# Patient Record
Sex: Female | Born: 1955 | Race: White | Hispanic: No | Smoking: Never smoker
Health system: Southern US, Community
[De-identification: ages and names within clinical notes are randomized; demographics above are authoritative.]

## PROBLEM LIST (undated history)

## (undated) DIAGNOSIS — Z8739 Personal history of other diseases of the musculoskeletal system and connective tissue: Secondary | ICD-10-CM

## (undated) DIAGNOSIS — N920 Excessive and frequent menstruation with regular cycle: Secondary | ICD-10-CM

## (undated) DIAGNOSIS — B379 Candidiasis, unspecified: Secondary | ICD-10-CM

## (undated) DIAGNOSIS — N898 Other specified noninflammatory disorders of vagina: Secondary | ICD-10-CM

## (undated) DIAGNOSIS — L900 Lichen sclerosus et atrophicus: Secondary | ICD-10-CM

## (undated) DIAGNOSIS — Z8669 Personal history of other diseases of the nervous system and sense organs: Secondary | ICD-10-CM

## (undated) DIAGNOSIS — B9689 Other specified bacterial agents as the cause of diseases classified elsewhere: Secondary | ICD-10-CM

## (undated) DIAGNOSIS — N939 Abnormal uterine and vaginal bleeding, unspecified: Secondary | ICD-10-CM

## (undated) DIAGNOSIS — N952 Postmenopausal atrophic vaginitis: Secondary | ICD-10-CM

## (undated) DIAGNOSIS — N3010 Interstitial cystitis (chronic) without hematuria: Secondary | ICD-10-CM

## (undated) DIAGNOSIS — D219 Benign neoplasm of connective and other soft tissue, unspecified: Secondary | ICD-10-CM

## (undated) DIAGNOSIS — N760 Acute vaginitis: Secondary | ICD-10-CM

## (undated) DIAGNOSIS — N946 Dysmenorrhea, unspecified: Secondary | ICD-10-CM

## (undated) HISTORY — DX: Personal history of other diseases of the musculoskeletal system and connective tissue: Z87.39

## (undated) HISTORY — DX: Acute vaginitis: N76.0

## (undated) HISTORY — PX: BREAST BIOPSY: SHX20

## (undated) HISTORY — DX: Dysmenorrhea, unspecified: N94.6

## (undated) HISTORY — DX: Abnormal uterine and vaginal bleeding, unspecified: N93.9

## (undated) HISTORY — DX: Other specified bacterial agents as the cause of diseases classified elsewhere: B96.89

## (undated) HISTORY — DX: Lichen sclerosus et atrophicus: L90.0

## (undated) HISTORY — PX: ABDOMINAL HYSTERECTOMY: SHX81

## (undated) HISTORY — DX: Benign neoplasm of connective and other soft tissue, unspecified: D21.9

## (undated) HISTORY — DX: Postmenopausal atrophic vaginitis: N95.2

## (undated) HISTORY — PX: TUBAL LIGATION: SHX77

## (undated) HISTORY — DX: Candidiasis, unspecified: B37.9

## (undated) HISTORY — DX: Other specified noninflammatory disorders of vagina: N89.8

## (undated) HISTORY — DX: Interstitial cystitis (chronic) without hematuria: N30.10

## (undated) HISTORY — DX: Excessive and frequent menstruation with regular cycle: N92.0

## (undated) HISTORY — DX: Personal history of other diseases of the nervous system and sense organs: Z86.69

---

## 1990-03-19 DIAGNOSIS — R87613 High grade squamous intraepithelial lesion on cytologic smear of cervix (HGSIL): Secondary | ICD-10-CM | POA: Insufficient documentation

## 1990-04-30 DIAGNOSIS — Q5210 Doubling of vagina, unspecified: Secondary | ICD-10-CM | POA: Insufficient documentation

## 1990-07-02 DIAGNOSIS — N946 Dysmenorrhea, unspecified: Secondary | ICD-10-CM | POA: Insufficient documentation

## 1990-08-13 DIAGNOSIS — N920 Excessive and frequent menstruation with regular cycle: Secondary | ICD-10-CM | POA: Insufficient documentation

## 1998-08-24 ENCOUNTER — Other Ambulatory Visit: Admission: RE | Admit: 1998-08-24 | Discharge: 1998-08-24 | Payer: Self-pay | Admitting: Obstetrics and Gynecology

## 1999-07-25 ENCOUNTER — Other Ambulatory Visit: Admission: RE | Admit: 1999-07-25 | Discharge: 1999-07-25 | Payer: Self-pay | Admitting: Obstetrics and Gynecology

## 2000-07-02 DIAGNOSIS — D219 Benign neoplasm of connective and other soft tissue, unspecified: Secondary | ICD-10-CM | POA: Insufficient documentation

## 2000-08-07 ENCOUNTER — Other Ambulatory Visit: Admission: RE | Admit: 2000-08-07 | Discharge: 2000-08-07 | Payer: Self-pay | Admitting: Obstetrics and Gynecology

## 2000-09-03 ENCOUNTER — Encounter (INDEPENDENT_AMBULATORY_CARE_PROVIDER_SITE_OTHER): Payer: Self-pay | Admitting: Specialist

## 2000-09-03 ENCOUNTER — Other Ambulatory Visit: Admission: RE | Admit: 2000-09-03 | Discharge: 2000-09-03 | Payer: Self-pay | Admitting: Obstetrics and Gynecology

## 2000-10-14 ENCOUNTER — Encounter (INDEPENDENT_AMBULATORY_CARE_PROVIDER_SITE_OTHER): Payer: Self-pay | Admitting: Specialist

## 2000-10-14 ENCOUNTER — Observation Stay (HOSPITAL_COMMUNITY): Admission: RE | Admit: 2000-10-14 | Discharge: 2000-10-15 | Payer: Self-pay | Admitting: Obstetrics and Gynecology

## 2001-12-24 ENCOUNTER — Other Ambulatory Visit: Admission: RE | Admit: 2001-12-24 | Discharge: 2001-12-24 | Payer: Self-pay | Admitting: Obstetrics and Gynecology

## 2001-12-24 DIAGNOSIS — Z634 Disappearance and death of family member: Secondary | ICD-10-CM | POA: Insufficient documentation

## 2002-07-02 DIAGNOSIS — D219 Benign neoplasm of connective and other soft tissue, unspecified: Secondary | ICD-10-CM | POA: Insufficient documentation

## 2004-02-11 ENCOUNTER — Other Ambulatory Visit: Admission: RE | Admit: 2004-02-11 | Discharge: 2004-02-11 | Payer: Self-pay | Admitting: Obstetrics and Gynecology

## 2005-03-28 ENCOUNTER — Other Ambulatory Visit: Admission: RE | Admit: 2005-03-28 | Discharge: 2005-03-28 | Payer: Self-pay | Admitting: Obstetrics and Gynecology

## 2006-05-30 DIAGNOSIS — B3731 Acute candidiasis of vulva and vagina: Secondary | ICD-10-CM | POA: Insufficient documentation

## 2006-06-17 DIAGNOSIS — N762 Acute vulvitis: Secondary | ICD-10-CM | POA: Insufficient documentation

## 2006-08-12 DIAGNOSIS — L900 Lichen sclerosus et atrophicus: Secondary | ICD-10-CM | POA: Insufficient documentation

## 2007-07-18 ENCOUNTER — Encounter: Admission: RE | Admit: 2007-07-18 | Discharge: 2007-07-18 | Payer: Self-pay | Admitting: Obstetrics and Gynecology

## 2007-07-18 ENCOUNTER — Encounter (INDEPENDENT_AMBULATORY_CARE_PROVIDER_SITE_OTHER): Payer: Self-pay | Admitting: Diagnostic Radiology

## 2008-01-16 ENCOUNTER — Encounter: Admission: RE | Admit: 2008-01-16 | Discharge: 2008-01-16 | Payer: Self-pay | Admitting: Obstetrics and Gynecology

## 2008-07-30 ENCOUNTER — Encounter: Admission: RE | Admit: 2008-07-30 | Discharge: 2008-07-30 | Payer: Self-pay | Admitting: Obstetrics and Gynecology

## 2008-08-09 DIAGNOSIS — M8580 Other specified disorders of bone density and structure, unspecified site: Secondary | ICD-10-CM | POA: Insufficient documentation

## 2009-08-26 ENCOUNTER — Encounter: Admission: RE | Admit: 2009-08-26 | Discharge: 2009-08-26 | Payer: Self-pay | Admitting: Obstetrics and Gynecology

## 2010-08-24 ENCOUNTER — Other Ambulatory Visit: Payer: Self-pay | Admitting: Obstetrics and Gynecology

## 2010-08-24 DIAGNOSIS — Z1231 Encounter for screening mammogram for malignant neoplasm of breast: Secondary | ICD-10-CM

## 2010-09-08 ENCOUNTER — Ambulatory Visit
Admission: RE | Admit: 2010-09-08 | Discharge: 2010-09-08 | Disposition: A | Discharge status: Home / Self Care | Payer: No Typology Code available for payment source | Source: Ambulatory Visit | Attending: Obstetrics and Gynecology | Admitting: Obstetrics and Gynecology

## 2010-09-08 DIAGNOSIS — Z1231 Encounter for screening mammogram for malignant neoplasm of breast: Secondary | ICD-10-CM

## 2010-11-17 NOTE — Op Note (Signed)
[facility]  Patient:    Angela Love, Angela Love                       MRN: [id number] Proc. Date: 10/14/00 Attending:  Erie Noe P. Pennie Rushing, M.D.                           Operative Report  PREOPERATIVE DIAGNOSES:       1. Uterine fibroids.                               2. Menorrhagia.                               3. Abnormal uterine bleeding.  POSTOPERATIVE DIAGNOSES:      1. Uterine fibroids.                               2. Menorrhagia.                               3. Abnormal uterine bleeding.  OPERATION:                    Total vaginal hysterectomy.  SURGEON:                      Vanessa P. Pennie Rushing, M.D.  ASSISTANT:                    Henreitta Leber, P.A.C.  ANESTHESIA:                   General orotracheal anesthesia.  ESTIMATED BLOOD LOSS:         150 cc.  COMPLICATIONS:                None.  FINDINGS:                     The uterus was enlarged to approximately 10-week size with a 4 to 5 cm myoma at the uterine fundus. The ovaries appeared normal bilaterally.  The tubes were status post tubal interruption.  DESCRIPTION OF PROCEDURE:     The patient was taken to the operating room after appropriate identification and placed on the operating table.  After the attainment of adequate general anesthesia, she was placed in the lithotomy position.  The perineum and vagina were prepped with multiple layers of Betadine and draped as a sterile field.  A Foley catheter was inserted into the bladder and connected to straight drainage.  A weighted speculum was placed in the posterior vagina and a dilute solution of Pitressin used to inject the cervicovaginal mucosa.  The cervix was then circumscribed and the bladder sharply dissected off the anterior cervix. The posterior peritoneum was then entered and tagged. The uterosacral ligaments on the right and left side were clamped, cut, and suture ligated.  The anterior peritoneum was entered.  The  paracervical tissues, parametrial tissues were clamped, sealed with a Ligasure and cut.  The uterus was then morcellated to allow inversion of the uterus with the removal of the cervix and several myomata with the intervening tissue. At that point, the remaining uterus could be inverted and the  upper pedicle on the left side sealed with a Ligasure and cut.  On the right side, the Ligasure was placed, the sealing done and the uterus cut away from the upper pedicle tissue; however, a Heaney clamp was then placed proximal to the Ligasure and the Ligasure cut away with that distal tissue and a suture of 0 Vicryl placed and tied fore and aft with a second suture of 0 Vicryl placed and tied fore and aft on the upper pedicle on the right side. This was held for further evaluation later.  Hemostasis was noted to be adequate except at the area of the right uterosacral ligament where the suture had pulled off and this suture was replaced with good hemostasis achieved. The upper pedicle on the right side had that suture cut.  The McCall culdoplasty suture was then placed in the posterior cul-de-sac incorporating the uterosacral ligaments and the intervening peritoneum and that suture was held.  The vaginal angles were then created with the held uterosacral ligaments through the anterior vaginal mucosa and the posterior vaginal mucosa with the angle tied down.  A similar procedure was carried out on the opposite side and the two vaginal angles were noted to be hemostatic.  The remainder of the vaginal cuff was then closed with figure-of-eight sutures of 0 Vicryl incorporating the anterior vaginal mucosa, anterior peritoneum, posterior peritoneum and posterior vaginal mucosa in each figure-of-eight suture.  These sutures were all of 0 Vicryl and hemostasis was noted to be adequate once the vaginal cuff had been closed. The culdoplasty suture was tied down. Hemostasis was noted to be adequate.  A short  period of time to insure adequate urine output after a fluid bolus was undertaken before the vaginal pack was placed.  The vaginal pack had been saturated with Estrace cream and was placed in the vagina.  At that time urine output had been 900 cc and clear.  The patient was awakened from general anesthesia and taken to the recovery room in satisfactory condition, having tolerated the procedure well with sponge and instrument counts correct.  SPECIMENS TO PATHOLOGY:       Uterus and cervix.   DD:  10/14/00 TD:  10/14/00 Job: [id number]

## 2010-11-17 NOTE — Discharge Summary (Signed)
[facility]  Patient:    Angela Love, Angela Love                        MRN: [id number] Adm. Date:  [id number] Disc. Date: [id number] Attending:  Shaune Spittle Dictator:   Marquis Lunch. Lowell Guitar, PA-C                           Discharge Summary  DISCHARGE DIAGNOSES:          Abnormal uterine bleeding and fibroid uterus.  PROCEDURE:                    Total vaginal hysterectomy.  HISTORY OF PRESENT ILLNESS:   Ms. Angela Love is a 55 year old separated white female para 2-0-0-2 with a several year history of abnormal uterine bleeding and uterine fibroids.  Patient presents for definitive treatment of her symptoms in the form of a total vaginal hysterectomy.  Please see patients dictated history and physical examination for details.  PHYSICAL EXAMINATION  VITAL SIGNS:                  Blood pressure 120/80, weight 138 pounds, height 5 feet 1 inch.  GENERAL:                      Within normal limits.  PELVIC:                       EG, BUS within normal limits.  Vagina is rugose. Cervix is nontender without lesions and is flush with the vagina.  Uterus is retroverted, approximately 8-10 weeks size, and firm.  Adnexa without tenderness or masses.  Rectovaginal without tenderness or masses.  HOSPITAL COURSE:              On the date of admission patient underwent a total vaginal hysterectomy tolerating procedure well.  The patient quickly resumed bowel and bladder function by postoperative day #1 and was deemed ready for discharge home.  Postoperative hemoglobin was 10, preoperative hemoglobin 12.1.  DISCHARGE MEDICATIONS:        1. Darvocet-N 100 one to two tablets q.4-h.                                  as needed for pain.                               2. Phenergan 12.5 mg one tablet q.i.d. as needed                                  for nausea.                               3. Stool softeners 60 mg one tablet b.i.d. until  bowel movements are regular.                               4. Patient was advised to resume her  preadmission medication.  FOLLOW-UP:                    Patient is to call [facility] for a six week postoperative examination with Dr. Pennie Rushing.  DISCHARGE INSTRUCTIONS:       She was given a copy of [facility] postoperative instruction sheet.  She was further advised to avoid driving for two weeks, heavy lifting for four weeks, and intercourse for six weeks.  FINAL PATHOLOGY:              Uterus and cervix:  Unremarkable cervix.  No dysplasia identified.  Secretory endometrium.  No evidence of hyperplasia or malignancy.  Leiomyomata, intramural. DD:  11/06/00 TD:  11/06/00 Job: [id number]

## 2010-11-17 NOTE — H&P (Signed)
[facility]  Patient:    Angela Love, Angela Love                        MRN: [id number] Adm. Date:  10/14/00 Attending:  Erie Noe P. Pennie Rushing, M.D. Dictator:   Henreitta Leber, P.A.-C.                         History and Physical  DATE OF BIRTH:                April 13, 1956  HISTORY OF PRESENT ILLNESS:   Angela Love is a 55 year old, separated white female, para 2-0-0-2, with a several-year history of abnormal uterine bleeding.  Patients menstrual flow of 7 to 10 days duration is characterized by heavy flow (has to change an extra absorbent pad every 30 minutes), severe cramping, and intermittent spotting throughout the month which has not been responsive to hormonal therapy.  Patients endometrial biopsy of March 2002 revealed disordered proliferative endometrium without hyperplasia or malignancy.  She was also found to have a normal TSH in February 2002. Patient presents for definitive treatment of her symptoms in the form of a total vaginal hysterectomy.  PAST MEDICAL HISTORY:         OB history:  Gravida 2, para 2-0-0-2.  Both of patients children were delivered by cesarean section.  GYN history:  Menarche at 55 years of age.  Patient has a history of irregular menstrual bleeding along with dysmenorrhea.  She has a past history of an abnormal PAP smear with her most recent one being normal, February 2002.  Patient uses laparoscopic tubal cautery as her method of contraception.  She has a history of uterine fibroids, fibrocystic breast changes, and a vaginal septum.  Patient had a normal mammogram, February 2002.  Surgical history:  Patient is status post cold knife conization of the cervix in 1991 with removal of her vaginal septum; she has had a laparoscopic tubal cautery; denies any history of blood transfusion.  Medical history is positive for interstitial cystitis and migraines.  FAMILY HISTORY:               Positive for hypertension and cancer.  CURRENT  MEDICATIONS:          Amitriptyline 25 mg at bedtime for  interstitial cystitis and migraines.  DRUG SENSITIVITIES:           Patient cannot tolerate codeine in that it causes severe nausea and vomiting.  She also develops a significant rash with swelling with the use of adhesives.  SOCIAL HISTORY:               Patient is separated and she works as a Consulting civil engineer.  HABITS:                       She does not use tobacco nor alcohol.  REVIEW OF SYSTEMS:            Positive for occasional arthralgias which are related to exercise, otherwise negative except as presented in history of present illness.  PHYSICAL EXAMINATION:  GENERAL:                      This is a well-developed, well-nourished, petite white female in no acute distress.  VITAL SIGNS:  Blood pressure 130/80, weight 138, height 5 feet, 1 inch tall.  EAR, NOSE, THROAT:            Exam is within normal limits.  Thyroid is not enlarged.  HEART:                        Regular rate and rhythm.  There is no murmur.  LUNGS:                        Without wheezes, rales, or rhonchi.  BACK:                         Without CVA tenderness.  ABDOMEN:                      Bowel sounds are present.  It is soft, nontender.  EXTREMITIES:                  Without clubbing, cyanosis, or edema.  PELVIC:                       EG, BUS is within normal limits.  Vagina is rugose.  Cervix is nontender without lesions and is flush with the vagina. Uterus is retroverted, approximately 8 to 10 weeks size and firm.  Adnexa without tenderness or masses.  Rectovaginal without tenderness or masses.  IMPRESSION:                   1. Abnormal uterine bleeding.                               2. Fibroid uterus.  DISPOSITION:                  A discussion was held with patient regarding medical and surgical management options for her symptoms; however, patient has chosen to undergo  hysterectomy.  She understands the implications for her procedure along with the risks involved to include but not limited to reaction to anesthesia, infection, bleeding, and damage to adjacent organs.  Patient has consented to undergo a total vaginal hysterectomy at [facility] [place] on October 14, 2000, at 7:30 a.m. DD:  10/08/00 TD:  10/08/00 Job: [id number]

## 2011-08-18 DIAGNOSIS — N762 Acute vulvitis: Secondary | ICD-10-CM

## 2011-08-18 DIAGNOSIS — M8580 Other specified disorders of bone density and structure, unspecified site: Secondary | ICD-10-CM

## 2011-08-18 DIAGNOSIS — Q5210 Doubling of vagina, unspecified: Secondary | ICD-10-CM

## 2011-08-18 DIAGNOSIS — B373 Candidiasis of vulva and vagina: Secondary | ICD-10-CM

## 2011-08-18 DIAGNOSIS — N946 Dysmenorrhea, unspecified: Secondary | ICD-10-CM

## 2011-08-18 DIAGNOSIS — R87613 High grade squamous intraepithelial lesion on cytologic smear of cervix (HGSIL): Secondary | ICD-10-CM

## 2011-08-18 DIAGNOSIS — L900 Lichen sclerosus et atrophicus: Secondary | ICD-10-CM

## 2011-08-18 DIAGNOSIS — B3731 Acute candidiasis of vulva and vagina: Secondary | ICD-10-CM

## 2011-08-18 DIAGNOSIS — Z634 Disappearance and death of family member: Secondary | ICD-10-CM

## 2011-08-18 DIAGNOSIS — D219 Benign neoplasm of connective and other soft tissue, unspecified: Secondary | ICD-10-CM

## 2011-08-18 DIAGNOSIS — N3010 Interstitial cystitis (chronic) without hematuria: Secondary | ICD-10-CM | POA: Insufficient documentation

## 2011-08-18 DIAGNOSIS — N920 Excessive and frequent menstruation with regular cycle: Secondary | ICD-10-CM

## 2011-09-25 ENCOUNTER — Other Ambulatory Visit: Payer: Self-pay | Admitting: Obstetrics and Gynecology

## 2011-09-25 DIAGNOSIS — Z1231 Encounter for screening mammogram for malignant neoplasm of breast: Secondary | ICD-10-CM

## 2011-10-04 ENCOUNTER — Ambulatory Visit (INDEPENDENT_AMBULATORY_CARE_PROVIDER_SITE_OTHER): Payer: No Typology Code available for payment source | Admitting: Obstetrics and Gynecology

## 2011-10-04 DIAGNOSIS — Z01419 Encounter for gynecological examination (general) (routine) without abnormal findings: Secondary | ICD-10-CM

## 2011-10-05 ENCOUNTER — Ambulatory Visit
Admission: RE | Admit: 2011-10-05 | Discharge: 2011-10-05 | Disposition: A | Discharge status: Home / Self Care | Payer: PRIVATE HEALTH INSURANCE | Source: Ambulatory Visit | Attending: Obstetrics and Gynecology | Admitting: Obstetrics and Gynecology

## 2011-10-05 DIAGNOSIS — Z1231 Encounter for screening mammogram for malignant neoplasm of breast: Secondary | ICD-10-CM

## 2012-08-28 ENCOUNTER — Other Ambulatory Visit: Payer: Self-pay | Admitting: Obstetrics and Gynecology

## 2012-08-28 ENCOUNTER — Other Ambulatory Visit: Payer: Self-pay

## 2012-08-28 DIAGNOSIS — Z1231 Encounter for screening mammogram for malignant neoplasm of breast: Secondary | ICD-10-CM

## 2012-10-10 ENCOUNTER — Ambulatory Visit
Admission: RE | Admit: 2012-10-10 | Discharge: 2012-10-10 | Disposition: A | Discharge status: Home / Self Care | Payer: BC Managed Care – PPO | Source: Ambulatory Visit

## 2012-10-10 DIAGNOSIS — Z1231 Encounter for screening mammogram for malignant neoplasm of breast: Secondary | ICD-10-CM

## 2013-09-29 ENCOUNTER — Other Ambulatory Visit: Payer: Self-pay

## 2013-09-29 DIAGNOSIS — Z1231 Encounter for screening mammogram for malignant neoplasm of breast: Secondary | ICD-10-CM

## 2013-10-16 ENCOUNTER — Ambulatory Visit
Admission: RE | Admit: 2013-10-16 | Discharge: 2013-10-16 | Disposition: A | Discharge status: Home / Self Care | Payer: BC Managed Care – PPO | Source: Ambulatory Visit

## 2013-10-16 DIAGNOSIS — Z1231 Encounter for screening mammogram for malignant neoplasm of breast: Secondary | ICD-10-CM

## 2014-11-16 ENCOUNTER — Other Ambulatory Visit: Payer: Self-pay

## 2014-11-16 DIAGNOSIS — Z1231 Encounter for screening mammogram for malignant neoplasm of breast: Secondary | ICD-10-CM

## 2014-11-22 ENCOUNTER — Ambulatory Visit
Admission: RE | Admit: 2014-11-22 | Discharge: 2014-11-22 | Disposition: A | Discharge status: Home / Self Care | Payer: Managed Care, Other (non HMO) | Source: Ambulatory Visit

## 2014-11-22 DIAGNOSIS — Z1231 Encounter for screening mammogram for malignant neoplasm of breast: Secondary | ICD-10-CM

## 2015-10-11 ENCOUNTER — Other Ambulatory Visit: Payer: Self-pay

## 2015-10-11 DIAGNOSIS — Z1231 Encounter for screening mammogram for malignant neoplasm of breast: Secondary | ICD-10-CM

## 2015-12-02 ENCOUNTER — Ambulatory Visit
Admission: RE | Admit: 2015-12-02 | Discharge: 2015-12-02 | Disposition: A | Discharge status: Home / Self Care | Payer: Managed Care, Other (non HMO) | Source: Ambulatory Visit

## 2015-12-02 DIAGNOSIS — Z1231 Encounter for screening mammogram for malignant neoplasm of breast: Secondary | ICD-10-CM

## 2016-11-16 ENCOUNTER — Other Ambulatory Visit: Payer: Self-pay | Admitting: Family Medicine

## 2016-11-16 DIAGNOSIS — Z1231 Encounter for screening mammogram for malignant neoplasm of breast: Secondary | ICD-10-CM

## 2016-12-07 ENCOUNTER — Ambulatory Visit
Admission: RE | Admit: 2016-12-07 | Discharge: 2016-12-07 | Disposition: A | Discharge status: Home / Self Care | Payer: Managed Care, Other (non HMO) | Source: Ambulatory Visit | Attending: Family Medicine | Admitting: Family Medicine

## 2016-12-07 DIAGNOSIS — Z1231 Encounter for screening mammogram for malignant neoplasm of breast: Secondary | ICD-10-CM

## 2017-12-06 ENCOUNTER — Other Ambulatory Visit: Payer: Self-pay | Admitting: Obstetrics and Gynecology

## 2017-12-06 DIAGNOSIS — Z1231 Encounter for screening mammogram for malignant neoplasm of breast: Secondary | ICD-10-CM

## 2017-12-27 ENCOUNTER — Ambulatory Visit
Admission: RE | Admit: 2017-12-27 | Discharge: 2017-12-27 | Disposition: A | Discharge status: Home / Self Care | Payer: Managed Care, Other (non HMO) | Source: Ambulatory Visit | Attending: Obstetrics and Gynecology | Admitting: Obstetrics and Gynecology

## 2017-12-27 DIAGNOSIS — Z1231 Encounter for screening mammogram for malignant neoplasm of breast: Secondary | ICD-10-CM

## 2019-01-27 ENCOUNTER — Other Ambulatory Visit: Payer: Self-pay | Admitting: Obstetrics and Gynecology

## 2019-01-27 DIAGNOSIS — Z1231 Encounter for screening mammogram for malignant neoplasm of breast: Secondary | ICD-10-CM

## 2019-03-13 ENCOUNTER — Other Ambulatory Visit: Payer: Self-pay

## 2019-03-13 ENCOUNTER — Ambulatory Visit
Admission: RE | Admit: 2019-03-13 | Discharge: 2019-03-13 | Disposition: A | Discharge status: Home / Self Care | Payer: BC Managed Care – PPO | Source: Ambulatory Visit | Attending: Obstetrics and Gynecology | Admitting: Obstetrics and Gynecology

## 2019-03-13 DIAGNOSIS — Z1231 Encounter for screening mammogram for malignant neoplasm of breast: Secondary | ICD-10-CM

## 2020-02-09 ENCOUNTER — Other Ambulatory Visit: Payer: Self-pay | Admitting: Obstetrics and Gynecology

## 2020-02-09 DIAGNOSIS — Z1231 Encounter for screening mammogram for malignant neoplasm of breast: Secondary | ICD-10-CM

## 2020-03-18 ENCOUNTER — Ambulatory Visit
Admission: RE | Admit: 2020-03-18 | Discharge: 2020-03-18 | Disposition: A | Discharge status: Home / Self Care | Payer: BC Managed Care – PPO | Source: Ambulatory Visit | Attending: Obstetrics and Gynecology | Admitting: Obstetrics and Gynecology

## 2020-03-18 ENCOUNTER — Other Ambulatory Visit: Payer: Self-pay

## 2020-03-18 DIAGNOSIS — Z1231 Encounter for screening mammogram for malignant neoplasm of breast: Secondary | ICD-10-CM

## 2021-02-08 ENCOUNTER — Other Ambulatory Visit: Payer: Self-pay | Admitting: Obstetrics and Gynecology

## 2021-02-08 DIAGNOSIS — Z1231 Encounter for screening mammogram for malignant neoplasm of breast: Secondary | ICD-10-CM

## 2021-03-31 ENCOUNTER — Other Ambulatory Visit: Payer: Self-pay

## 2021-03-31 ENCOUNTER — Ambulatory Visit
Admission: RE | Admit: 2021-03-31 | Discharge: 2021-03-31 | Disposition: A | Discharge status: Home / Self Care | Payer: Medicare Other | Source: Ambulatory Visit | Attending: Obstetrics and Gynecology | Admitting: Obstetrics and Gynecology

## 2021-03-31 DIAGNOSIS — Z1231 Encounter for screening mammogram for malignant neoplasm of breast: Secondary | ICD-10-CM

## 2021-05-19 ENCOUNTER — Other Ambulatory Visit: Payer: Self-pay | Admitting: Obstetrics and Gynecology

## 2022-03-23 ENCOUNTER — Other Ambulatory Visit: Payer: Self-pay | Admitting: Obstetrics and Gynecology

## 2022-03-23 DIAGNOSIS — Z1231 Encounter for screening mammogram for malignant neoplasm of breast: Secondary | ICD-10-CM

## 2022-04-18 ENCOUNTER — Ambulatory Visit
Admission: EM | Admit: 2022-04-18 | Discharge: 2022-04-18 | Disposition: A | Discharge status: Home / Self Care | Payer: Medicare Other | Attending: Family Medicine | Admitting: Family Medicine

## 2022-04-18 DIAGNOSIS — I10 Essential (primary) hypertension: Secondary | ICD-10-CM | POA: Diagnosis not present

## 2022-04-18 MED ORDER — LISINOPRIL 10 MG PO TABS: 10.0000 mg | ORAL_TABLET | Freq: Every day | ORAL | 1 refills | Status: DC

## 2022-04-18 NOTE — Discharge Instructions (Signed)
Start lisinopril tonight.  Take 1/2 tablet (5 mg) for the first 3 nights.  Keep track your blood pressure.  I anticipate you will need 10 mg a day for good control.  Call for problems. Recommend you establish with a primary care doctor.  Start calling today to get an appointment

## 2022-04-18 NOTE — ED Provider Notes (Signed)
[facility]    CSN: [id number] Arrival date & time: 04/18/22  1103      History   Chief Complaint Chief Complaint  Patient presents with   Hypertension    HPI Angela Love is a 66 y.o. female.   HPI  Patient is here for hypertension.  She has had high blood pressure off and on for years.  Previously was on amlodipine.  She stopped this because of pedal edema.  She states that she checks her blood pressure at home.  It stays in the 140/90 range.  She does not have a primary care doctor.  I reviewed her last medical visit with GYN earlier this year, her blood pressure was 160/102 at that time.  Today her blood pressure is higher, today blood pressure manually and it was 178/96 She has elevated blood pressure dating back to a visit in 2015 where it was 198/120 She went to her dentist today for routine cleaning and they would not take care of her without getting her blood pressure under control She agrees to establish with a PCP She is not having any headache, dizzy spells, visual changes, chest pain or shortness of breath  Past Medical History:  Diagnosis Date   Abnormal uterine bleeding    BV (bacterial vaginosis)    Dysmenorrhea    Fibroid    Resolved in 2002   H/O osteopenia    Hx of migraines    Interstitial cystitis    Lichen sclerosus Controlled    Menorrhagia    Monilia infection    Vaginal atrophy    Vaginal dryness     Patient Active Problem List   Diagnosis Date Noted   Interstitial cystitis 08/18/2011   Osteopenia 67/67/2094   Lichen sclerosus 70/96/2836   Vulvitis 06/17/2006   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      Home Medications    Prior to Admission medications   Medication Sig Start Date End Date Taking? Authorizing Provider  lisinopril (ZESTRIL) 10 MG tablet Take 1 tablet (10 mg total) by mouth daily. 04/18/22  Yes Raylene Everts, MD  amitriptyline (ELAVIL) 10 MG tablet Take 10 mg by mouth at bedtime. Unsure dose    [provider]  calcium carbonate 200 MG capsule Take 250 mg by mouth 2 (two) times daily with a meal. Unsure dose    [provider]  cholecalciferol (VITAMIN D) 1000 UNITS tablet Take 1,000 Units by mouth daily. Unsure dose    [provider]  clobetasol ointment (TEMOVATE) 0.05 % Apply topically 2 (two) times daily.    [provider]  estradiol (ESTRACE) 0.5 MG tablet Take 0.5 mg by mouth daily.    [provider]    Family History History reviewed. No pertinent family history.  Social History Social History   Tobacco Use   Smoking status: Never   Smokeless tobacco: Never  Vaping Use   Vaping Use: Never used     Allergies   Bextra [valdecoxib], Codeine, Darvocet [propoxyphene n-acetaminophen], Influenza vaccines, Metronidazole, Morphine  and related, and Other   Review of Systems Review of Systems See HPI  Physical Exam Triage Vital Signs ED Triage Vitals  Enc Vitals Group     BP 04/18/22 1133 (!) 200/130     Pulse Rate 04/18/22 1133 90     Resp 04/18/22 1133 18     Temp 04/18/22 1133 98.3 F (36.8 C)     Temp Source 04/18/22 1133 Oral     SpO2 04/18/22 1133 99 %     Weight --      Height --      Head Circumference --      Peak Flow --      Pain Score 04/18/22 1131 0     Pain Loc --      Pain Edu? --      Excl. in [place]? --    No data found.  Updated Vital Signs BP (!) 200/130 (BP Location: Left Arm)   Pulse 90   Temp 98.3 F (36.8 C) (Oral)   Resp 18   SpO2 99%      Physical Exam Constitutional:      General: She is not in acute  distress.    Appearance: She is well-developed.     Comments: Overweight  HENT:     Head: Normocephalic and atraumatic.  Eyes:     Conjunctiva/sclera: Conjunctivae normal.     Pupils: Pupils are equal, round, and reactive to light.  Cardiovascular:     Rate and Rhythm: Normal rate and regular rhythm.     Heart sounds: Normal heart sounds.  Pulmonary:     Effort: Pulmonary effort is normal. No respiratory distress.     Breath sounds: Normal breath sounds.  Abdominal:     General: There is no distension.     Palpations: Abdomen is soft.  Musculoskeletal:        General: Normal range of motion.     Cervical back: Normal range of motion.  Skin:    General: Skin is warm and dry.  Neurological:     Mental Status: She is alert.      UC Treatments / Results  Labs (all labs ordered are listed, but only abnormal results are displayed) Labs Reviewed - No data to display  EKG   Radiology No results found.  Procedures Procedures (including critical care time)  Medications Ordered in UC Medications - No data to display  Initial Impression / Assessment and Plan / UC Course  I have reviewed the triage vital signs and the nursing notes.  Pertinent labs & imaging results that were available during my care of the patient were reviewed by me and considered in my medical decision making (see chart for details).    I explained to the patient that she does have hypertension.  She needs to be on medication.  She needs lab work and regular medical visits.  PCP follow-up is emphasized Final Clinical Impressions(s) / UC Diagnoses   Final diagnoses:  Hypertension, unspecified type     Discharge Instructions      Start lisinopril tonight.  Take 1/2 tablet (5 mg) for the first 3 nights.  Keep track your blood pressure.  I anticipate you will need 10 mg a day for good control.  Call for problems. Recommend you establish with a primary care doctor.  Start calling today to get an  appointment   ED Prescriptions     Medication Sig Dispense Auth. Provider   lisinopril (ZESTRIL) 10 MG tablet Take  1 tablet (10 mg total) by mouth daily. 30 tablet Raylene Everts, MD      PDMP not reviewed this encounter.   Raylene Everts, MD 04/18/22 1229

## 2022-04-18 NOTE — ED Triage Notes (Addendum)
Pt here today for hypertension. Was at dentist when they took her BP and it was 170/108 . (Has dentist phobia) Went home and took again and it was 170/105. Has been borderline hypertensive for several years. Was rx'd amlodipine about 5 years but had to stop due to pitting edema. Home monitors and its been around 140/90 previously.

## 2022-04-19 ENCOUNTER — Telehealth: Payer: Self-pay | Admitting: Emergency Medicine

## 2022-04-19 NOTE — Telephone Encounter (Signed)
Call to see how Angela Love was today. No answer - voice mail left  for callback with any questions or concerns

## 2022-04-20 ENCOUNTER — Ambulatory Visit: Payer: Medicare Other

## 2022-05-08 IMAGING — MG MM DIGITAL SCREENING BILAT W/ TOMO AND CAD
8 series · 8 of 24 positions shown · non-contrast
Comparison: Previous exam(s).

ACR Breast Density Category a: The breast tissue is almost entirely
fatty.

CLINICAL DATA: Screening.

EXAM:
DIGITAL SCREENING BILATERAL MAMMOGRAM WITH TOMOSYNTHESIS AND CAD
TECHNIQUE: Bilateral screening digital craniocaudal and mediolateral oblique
mammograms were obtained. Bilateral screening digital breast
tomosynthesis was performed. The images were evaluated with
computer-aided detection.

[R CC synth-2D]
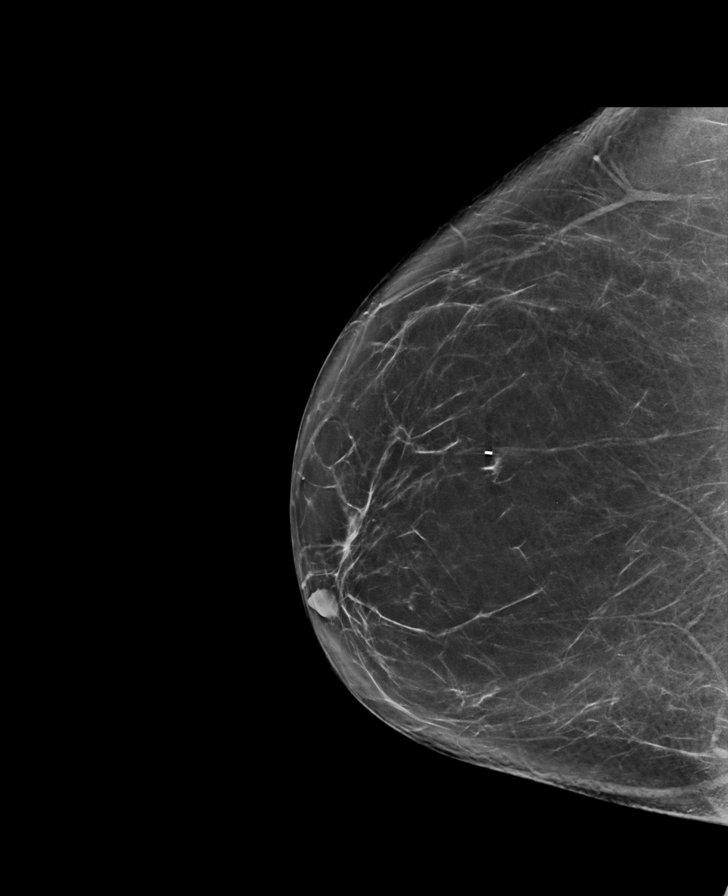

[L CC synth-2D]
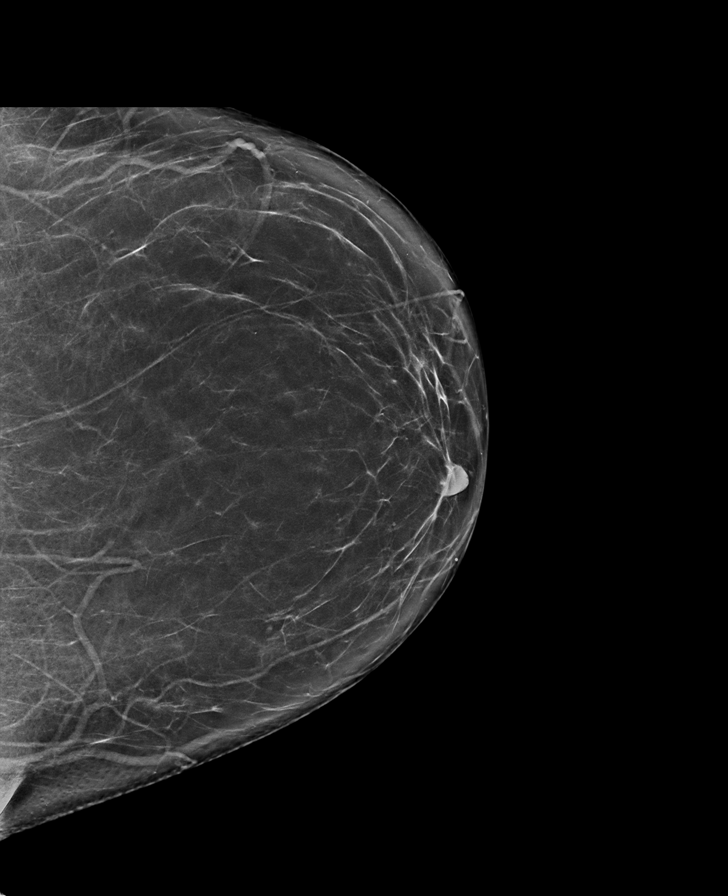

[L MLO synth-2D]
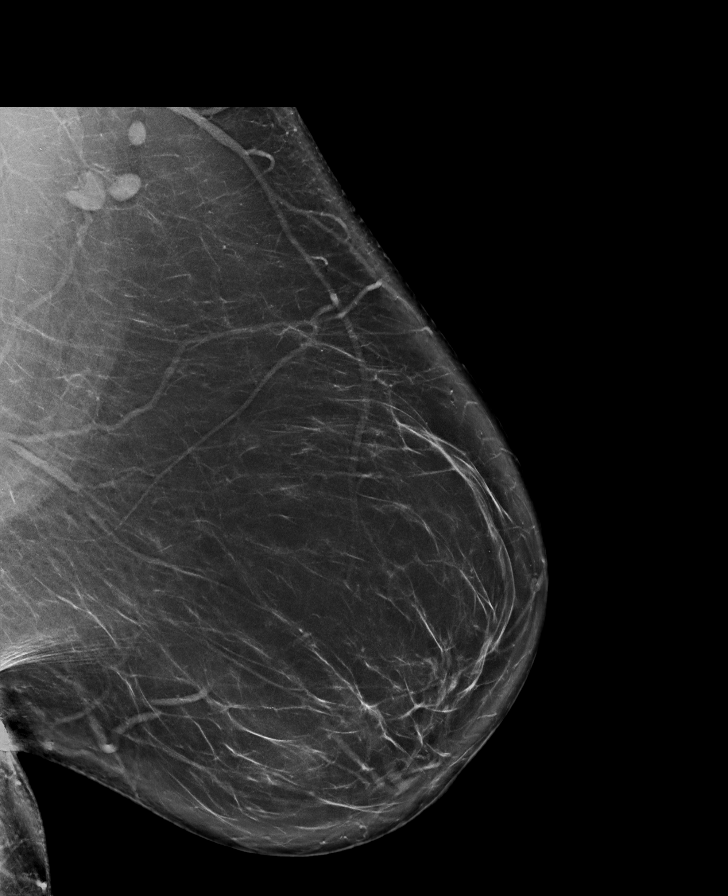

[R MLO synth-2D]
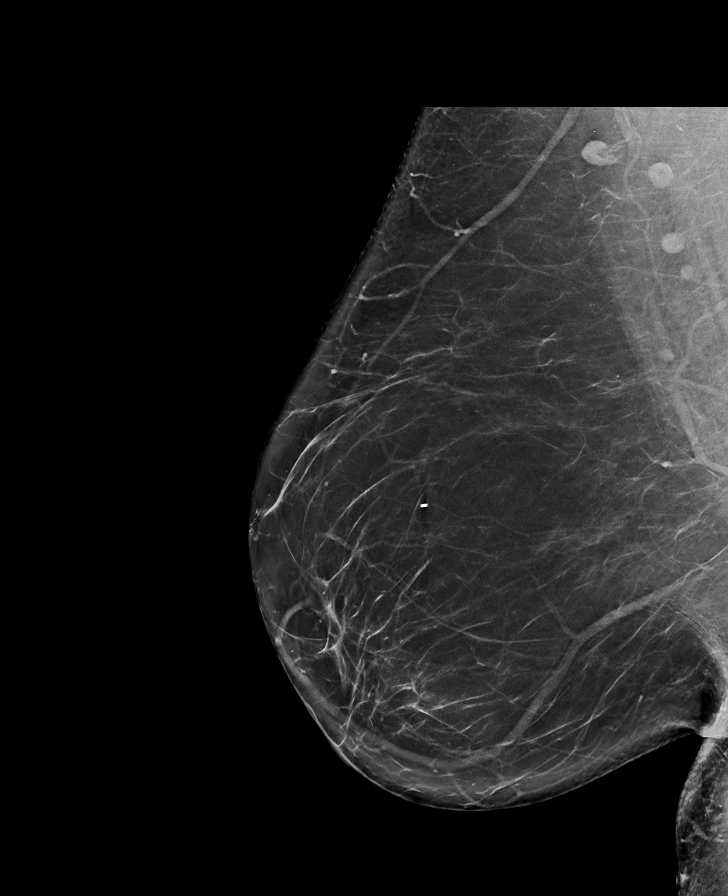

[L CC tomo · tomo slice 41/81.0]
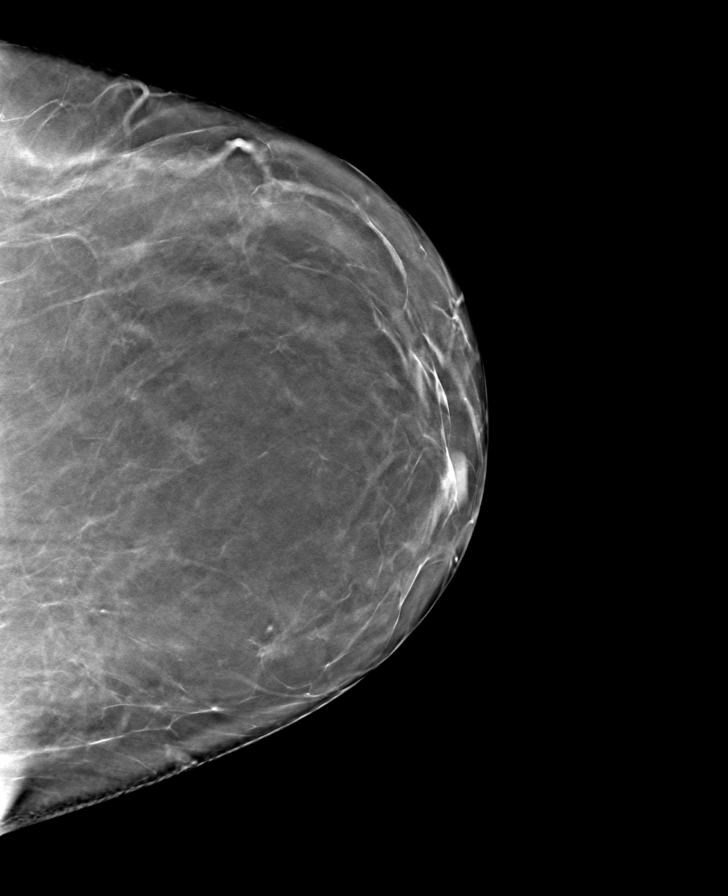

[L MLO tomo · tomo slice 47/94.0]
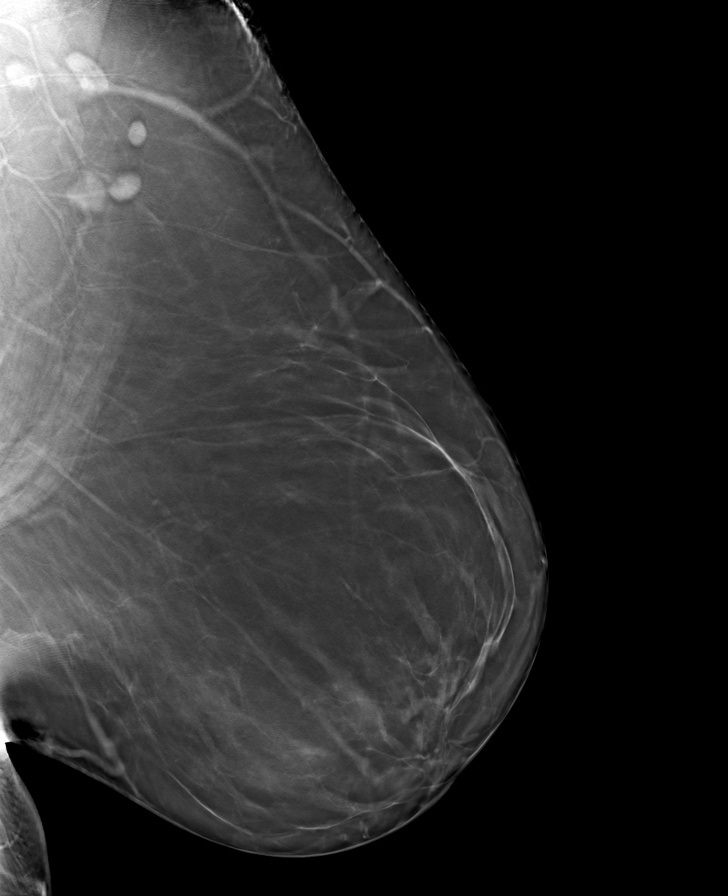

[R CC tomo · tomo slice 41/81.0]
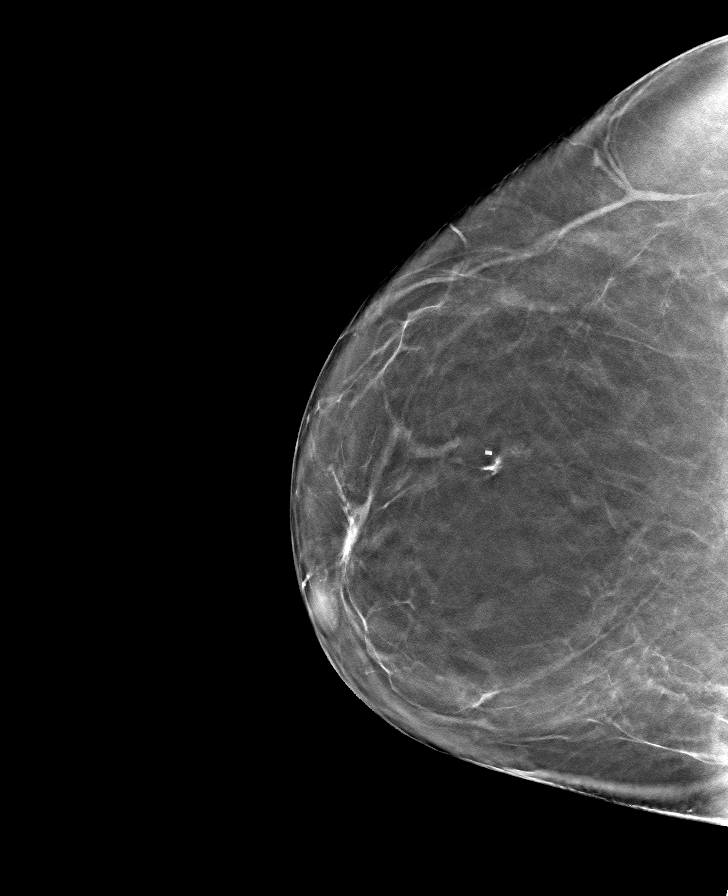

[R MLO tomo · tomo slice 45/90.0]
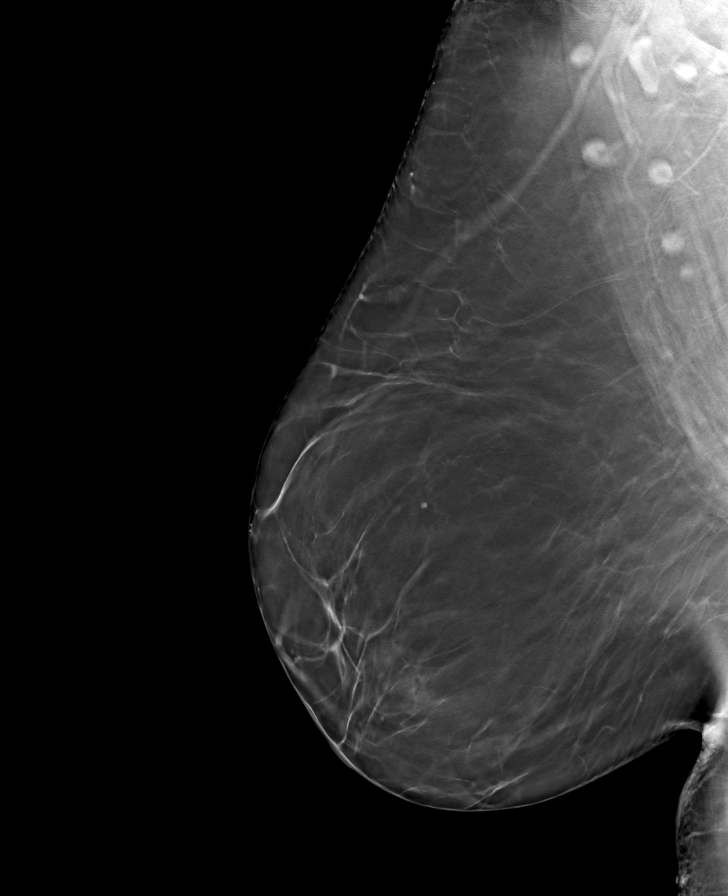

[8 of 24 positions shown; findings below may reference images not displayed]

FINDINGS: There are no findings suspicious for malignancy.
IMPRESSION: No mammographic evidence of malignancy. A result letter of this
screening mammogram will be mailed directly to the patient.

RECOMMENDATION:
Screening mammogram in one year. (Code:0E-3-N98)

BI-RADS CATEGORY  1: Negative.

## 2022-06-01 ENCOUNTER — Ambulatory Visit (INDEPENDENT_AMBULATORY_CARE_PROVIDER_SITE_OTHER): Payer: Medicare Other | Admitting: Family Medicine

## 2022-06-01 ENCOUNTER — Encounter: Payer: Self-pay | Admitting: Family Medicine

## 2022-06-01 VITALS — BP 184/91 | HR 103 | Ht 59.0 in | Wt 170.0 lb

## 2022-06-01 DIAGNOSIS — R7303 Prediabetes: Secondary | ICD-10-CM | POA: Diagnosis not present

## 2022-06-01 DIAGNOSIS — I10 Essential (primary) hypertension: Secondary | ICD-10-CM | POA: Diagnosis not present

## 2022-06-01 MED ORDER — LISINOPRIL 10 MG PO TABS: 10.0000 mg | ORAL_TABLET | Freq: Every day | ORAL | 3 refills | Status: DC

## 2022-06-01 NOTE — Assessment & Plan Note (Signed)
-   will continue patient at lisinopril '10mg'$  she says her Bps at work (she works at an [employer]) have been in the 120s/130s. Will have patient monitor and reach out if BP is elevated - have ordered CBC, CMP, microalbumin/cr ratio - have also ordered lipid panel

## 2022-06-01 NOTE — Progress Notes (Signed)
New Patient Office Visit  Subjective    Patient ID: Angela Love, female    DOB: 25-Apr-1956  Age: 66 y.o. MRN: [id number]  CC:  Chief Complaint  Patient presents with   Establish Care    HPI Angela Love presents to establish care. She has a hx of HTN and is on lisinopril '10mg'$ . She has been tolerating this very well. Her BP at work is 135/86.   Outpatient Encounter Medications as of 06/01/2022  Medication Sig   amitriptyline (ELAVIL) 10 MG tablet Take 10 mg by mouth at bedtime. Unsure dose   [DISCONTINUED] lisinopril (ZESTRIL) 10 MG tablet Take 1 tablet (10 mg total) by mouth daily.   clobetasol ointment (TEMOVATE) 0.05 % Apply topically 2 (two) times daily. (Patient not taking: Reported on 06/01/2022)   lisinopril (ZESTRIL) 10 MG tablet Take 1 tablet (10 mg total) by mouth daily.   [DISCONTINUED] calcium carbonate 200 MG capsule Take 250 mg by mouth 2 (two) times daily with a meal. Unsure dose (Patient not taking: Reported on 06/01/2022)   [DISCONTINUED] cholecalciferol (VITAMIN D) 1000 UNITS tablet Take 1,000 Units by mouth daily. Unsure dose (Patient not taking: Reported on 06/01/2022)   [DISCONTINUED] estradiol (ESTRACE) 0.5 MG tablet Take 0.5 mg by mouth daily. (Patient not taking: Reported on 06/01/2022)   No facility-administered encounter medications on file as of 06/01/2022.    Past Medical History:  Diagnosis Date   Abnormal uterine bleeding    BV (bacterial vaginosis)    Dysmenorrhea    Fibroid    Resolved in 2002   H/O osteopenia    Hx of migraines    Interstitial cystitis    Lichen sclerosus Controlled    Menorrhagia    Monilia infection    Vaginal atrophy    Vaginal dryness     Past Surgical History:  Procedure Laterality Date   BREAST BIOPSY Right    benign   TUBAL LIGATION      No family history on file.  Social History   Socioeconomic History   Marital status: Married    Spouse name: Not on file   Number of children: Not on file   Years of  education: Not on file   Highest education level: Not on file  Occupational History   Not on file  Tobacco Use   Smoking status: Never   Smokeless tobacco: Never  Vaping Use   Vaping Use: Never used  Substance and Sexual Activity   Alcohol use: Not on file   Drug use: Not on file   Sexual activity: Not on file  Other Topics Concern   Not on file  Social History Narrative   Not on file   Social Determinants of Health   Financial Resource Strain: Not on file  Food Insecurity: Not on file  Transportation Needs: Not on file  Physical Activity: Not on file  Stress: Not on file  Social Connections: Not on file  Intimate Partner Violence: Not on file    Review of Systems  Constitutional:  Negative for chills and fever.  Respiratory:  Negative for cough and shortness of breath.   Cardiovascular:  Negative for chest pain.  Neurological:  Negative for headaches.        Objective    BP (!) 184/91   Pulse (!) 103   Ht '4\' 11"'$  (1.499 m)   Wt 170 lb (77.1 kg)   SpO2 99%   BMI 34.34 kg/m   Physical Exam Vitals and nursing note reviewed.  Constitutional:      General: She is not in acute distress.    Appearance: Normal appearance.  HENT:     Head: Normocephalic and atraumatic.     Right Ear: External ear normal.     Left Ear: External ear normal.     Nose: Nose normal.  Eyes:     Conjunctiva/sclera: Conjunctivae normal.  Cardiovascular:     Rate and Rhythm: Normal rate and regular rhythm.  Pulmonary:     Effort: Pulmonary effort is normal.     Breath sounds: Normal breath sounds.  Neurological:     General: No focal deficit present.     Mental Status: She is alert and oriented to person, place, and time.  Psychiatric:        Mood and Affect: Mood normal.        Behavior: Behavior normal.        Thought Content: Thought content normal.        Judgment: Judgment normal.       Assessment & Plan:   Problem List Items Addressed This Visit        Cardiovascular and Mediastinum   Primary hypertension - Primary    - will continue patient at lisinopril '10mg'$  she says her Bps at work (she works at an [employer]) have been in the 120s/130s. Will have patient monitor and reach out if BP is elevated - have ordered CBC, CMP, microalbumin/cr ratio - have also ordered lipid panel      Relevant Medications   lisinopril (ZESTRIL) 10 MG tablet   Other Relevant Orders   CBC   COMPLETE METABOLIC PANEL WITH GFR   Lipid panel   Microalbumin / creatinine urine ratio     Other   Prediabetes    - pt reports hx of prediabetes with A1c of 5.8  - will re order A1c to assess      Relevant Orders   HgB A1c    Return in about 6 months (around 12/01/2022).   Owens Loffler, DO

## 2022-06-01 NOTE — Assessment & Plan Note (Signed)
-   pt reports hx of prediabetes with A1c of 5.8  - will re order A1c to assess

## 2022-06-02 LAB — CBC
HCT: 45.3 % — ABNORMAL HIGH (ref 35.0–45.0)
Hemoglobin: 15.3 g/dL (ref 11.7–15.5)
MCH: 29.7 pg (ref 27.0–33.0)
MCHC: 33.8 g/dL (ref 32.0–36.0)
MCV: 88 fL (ref 80.0–100.0)
MPV: 10.8 fL (ref 7.5–12.5)
Platelets: 267 10*3/uL (ref 140–400)
RBC: 5.15 10*6/uL — ABNORMAL HIGH (ref 3.80–5.10)
RDW: 12.5 % (ref 11.0–15.0)
WBC: 6.5 10*3/uL (ref 3.8–10.8)

## 2022-06-02 LAB — COMPLETE METABOLIC PANEL WITH GFR
AG Ratio: 1.6 (calc) (ref 1.0–2.5)
ALT: 22 U/L (ref 6–29)
AST: 17 U/L (ref 10–35)
Albumin: 4.2 g/dL (ref 3.6–5.1)
Alkaline phosphatase (APISO): 83 U/L (ref 37–153)
BUN: 16 mg/dL (ref 7–25)
CO2: 28 mmol/L (ref 20–32)
Calcium: 9.7 mg/dL (ref 8.6–10.4)
Chloride: 104 mmol/L (ref 98–110)
Creat: 0.78 mg/dL (ref 0.50–1.05)
Globulin: 2.6 g/dL (calc) (ref 1.9–3.7)
Glucose, Bld: 101 mg/dL — ABNORMAL HIGH (ref 65–99)
Potassium: 4.1 mmol/L (ref 3.5–5.3)
Sodium: 141 mmol/L (ref 135–146)
Total Bilirubin: 0.5 mg/dL (ref 0.2–1.2)
Total Protein: 6.8 g/dL (ref 6.1–8.1)
eGFR: 83 mL/min/{1.73_m2} (ref >=60)

## 2022-06-02 LAB — MICROALBUMIN / CREATININE URINE RATIO
Creatinine, Urine: 57 mg/dL (ref 20–275)
Microalb Creat Ratio: 7 mcg/mg creat (ref <30)
Microalb, Ur: 0.4 mg/dL

## 2022-06-02 LAB — LIPID PANEL
Cholesterol: 175 mg/dL (ref <200)
HDL: 62 mg/dL (ref >=50)
LDL Cholesterol (Calc): 97 mg/dL (calc)
Non-HDL Cholesterol (Calc): 113 mg/dL (calc) (ref <130)
Total CHOL/HDL Ratio: 2.8 (calc) (ref <5.0)
Triglycerides: 72 mg/dL (ref <150)

## 2022-06-02 LAB — HEMOGLOBIN A1C
Hgb A1c MFr Bld: 5.8 % of total Hgb — ABNORMAL HIGH (ref <5.7)
Mean Plasma Glucose: 120 mg/dL
eAG (mmol/L): 6.6 mmol/L

## 2022-06-15 ENCOUNTER — Ambulatory Visit
Admission: RE | Admit: 2022-06-15 | Discharge: 2022-06-15 | Disposition: A | Discharge status: Home / Self Care | Payer: Medicare Other | Source: Ambulatory Visit | Attending: Obstetrics and Gynecology | Admitting: Obstetrics and Gynecology

## 2022-06-15 DIAGNOSIS — Z1231 Encounter for screening mammogram for malignant neoplasm of breast: Secondary | ICD-10-CM

## 2022-12-03 ENCOUNTER — Ambulatory Visit (INDEPENDENT_AMBULATORY_CARE_PROVIDER_SITE_OTHER): Payer: Medicare Other | Admitting: Family Medicine

## 2022-12-03 DIAGNOSIS — Z1211 Encounter for screening for malignant neoplasm of colon: Secondary | ICD-10-CM

## 2022-12-03 DIAGNOSIS — Z0000 Encounter for general adult medical examination without abnormal findings: Secondary | ICD-10-CM

## 2022-12-03 NOTE — Progress Notes (Signed)
MEDICARE ANNUAL WELLNESS VISIT  12/03/2022  Telephone Visit Disclaimer This Medicare AWV was conducted by telephone due to national recommendations for restrictions regarding the COVID-19 Pandemic (e.g. social distancing).  I verified, using two identifiers, that I am speaking with Angela Love or their authorized healthcare agent. I discussed the limitations, risks, security, and privacy concerns of performing an evaluation and management service by telephone and the potential availability of an in-person appointment in the future. The patient expressed understanding and agreed to proceed.  Location of Patient: Home Location of Provider (nurse):  In the office.  Subjective:    Angela Love is a 67 y.o. female patient of Tamera Punt, Erika S, DO who had a Medicare Annual Wellness Visit today via telephone. [name] is Retired and lives alone. she has 1 child. she reports that she is socially active and does interact with friends/family regularly. she is moderately physically active and enjoys reading.  Patient Care Team: Charlton Amor, DO as PCP - General (Family Medicine)     12/03/2022    9:08 AM 06/01/2022    2:20 PM  Advanced Directives  Does Patient Have a Medical Advance Directive? No No  Would patient like information on creating a medical advance directive? No - Patient declined No - Patient declined    Hospital Utilization Over the Past 12 Months: # of hospitalizations or ER visits: 0 # of surgeries: 0  Review of Systems    Patient reports that her overall health is unchanged compared to last year.  History obtained from chart review and the patient  Patient Reported Readings (BP, Pulse, CBG, Weight, etc) none  Pain Assessment Pain : No/denies pain     Current Medications & Allergies (verified) Allergies as of 12/03/2022       Reactions   Amlodipine Other (See Comments)   Leg edema   Bextra [valdecoxib]    Codeine Nausea And Vomiting   Darvocet [propoxyphene  N-acetaminophen]    Influenza Vaccines Itching   Meloxicam Other (See Comments)   Other Reaction(s): GI Intolerance Abdominal discomfort/pain   Metronidazole    GEL   Morphine And Codeine Nausea Only   Other    Band-aids        Medication List        Accurate as of December 03, 2022  9:26 AM. If you have any questions, ask your nurse or doctor.          amitriptyline 10 MG tablet Commonly known as: ELAVIL Take 10 mg by mouth at bedtime. Unsure dose   clobetasol ointment 0.05 % Commonly known as: TEMOVATE Apply topically 2 (two) times daily.   lisinopril 10 MG tablet Commonly known as: ZESTRIL Take 1 tablet (10 mg total) by mouth daily.        History (reviewed): Past Medical History:  Diagnosis Date   Abnormal uterine bleeding    BV (bacterial vaginosis)    Dysmenorrhea    Fibroid    Resolved in 2002   H/O osteopenia    Hx of migraines    Interstitial cystitis    Lichen sclerosus Controlled    Menorrhagia    Monilia infection    Vaginal atrophy    Vaginal dryness    Past Surgical History:  Procedure Laterality Date   ABDOMINAL HYSTERECTOMY     BREAST BIOPSY Right    benign   TUBAL LIGATION     Family History  Problem Relation Age of Onset   Hypertension Other    Stroke  Other    Skin cancer Other    Social History   Socioeconomic History   Marital status: Divorced    Spouse name: Not on file   Number of children: 2   Years of education: 13   Highest education level: Some college, no degree  Occupational History   Occupation: CMA    Comment: Working full time  Tobacco Use   Smoking status: Never   Smokeless tobacco: Never  Vaping Use   Vaping Use: Never used  Substance and Sexual Activity   Alcohol use: Yes    Comment: occ   Drug use: Never   Sexual activity: Not Currently    Partners: Male    Birth control/protection: Abstinence  Other Topics Concern   Not on file  Social History Narrative   Lives alone. She has one child, one  has deceased. She is still working full time as CMA. She enjoys reading.    Social Determinants of Health   Financial Resource Strain: Low Risk  (12/03/2022)   Overall Financial Resource Strain (CARDIA)    Difficulty of Paying Living Expenses: Not hard at all  Food Insecurity: No Food Insecurity (12/03/2022)   Hunger Vital Sign    Worried About Running Out of Food in the Last Year: Never true    Ran Out of Food in the Last Year: Never true  Transportation Needs: No Transportation Needs (12/03/2022)   PRAPARE - Administrator, Civil Service (Medical): No    Lack of Transportation (Non-Medical): No  Physical Activity: Sufficiently Active (12/03/2022)   Exercise Vital Sign    Days of Exercise per Week: 5 days    Minutes of Exercise per Session: 60 min  Stress: No Stress Concern Present (12/03/2022)   [employer] of Occupational Health - Occupational Stress Questionnaire    Feeling of Stress : Not at all  Social Connections: Socially Isolated (12/03/2022)   Social Connection and Isolation Panel [NHANES]    Frequency of Communication with Friends and Family: More than three times a week    Frequency of Social Gatherings with Friends and Family: More than three times a week    Attends Religious Services: Never    Database administrator or Organizations: No    Attends Banker Meetings: Never    Marital Status: Divorced    Activities of Daily Living    12/03/2022    9:13 AM  In your present state of health, do you have any difficulty performing the following activities:  Hearing? 0  Vision? 0  Difficulty concentrating or making decisions? 0  Walking or climbing stairs? 0  Dressing or bathing? 0  Doing errands, shopping? 0  Preparing Food and eating ? N  Using the Toilet? N  In the past six months, have you accidently leaked urine? N  Do you have problems with loss of bowel control? N  Managing your Medications? N  Managing your Finances? N  Housekeeping or  managing your Housekeeping? N    Patient Education/ Literacy How often do you need to have someone help you when you read instructions, pamphlets, or other written materials from your doctor or pharmacy?: 1 - Never What is the last grade level you completed in school?: 12th and some college  Exercise Current Exercise Habits: Home exercise routine, Type of exercise: Other - see comments (yard work), Time (Minutes): 60, Frequency (Times/Week): 5, Weekly Exercise (Minutes/Week): 300, Intensity: Moderate, Exercise limited by: None identified  Diet Patient reports consuming  3 meals a day and 2 snack(s) a day Patient reports that her primary diet is: Regular Patient reports that she does have regular access to food.   Depression Screen    12/03/2022    9:08 AM 06/01/2022    2:22 PM  PHQ 2/9 Scores  PHQ - 2 Score 0 1  PHQ- 9 Score  1     Fall Risk    12/03/2022    9:08 AM 06/01/2022    2:21 PM  Fall Risk   Falls in the past year? 0 0  Number falls in past yr: 0 0  Injury with Fall? 0 0  Risk for fall due to : No Fall Risks No Fall Risks  Follow up Falls evaluation completed Falls evaluation completed     Objective:  Niketa Myung seemed alert and oriented and she participated appropriately during our telephone visit.  Blood Pressure Weight BMI  BP Readings from Last 3 Encounters:  06/01/22 (!) 184/91  04/18/22 (!) 200/130   Wt Readings from Last 3 Encounters:  06/01/22 170 lb (77.1 kg)   BMI Readings from Last 1 Encounters:  06/01/22 34.34 kg/m    *Unable to obtain current vital signs, weight, and BMI due to telephone visit type  Hearing/Vision  Opie did not seem to have difficulty with hearing/understanding during the telephone conversation Reports that she has had a formal eye exam by an eye care professional within the past year Reports that she has not had a formal hearing evaluation within the past year *Unable to fully assess hearing and vision during telephone  visit type  Cognitive Function:    12/03/2022    9:15 AM  6CIT Screen  What Year? 0 points  What month? 0 points  What time? 0 points  Count back from 20 0 points  Months in reverse 0 points  Repeat phrase 0 points  Total Score 0 points   (Normal:0-7, Significant for Dysfunction: >8)  Normal Cognitive Function Screening: Yes   Immunization & Health Maintenance Record Immunization History  Administered Date(s) Administered   Influenza,inj,quad, With Preservative 03/03/2015    Health Maintenance  Topic Date Due   DTaP/Tdap/Td (1 - Tdap) Never done   COVID-19 Vaccine (1) 12/19/2022 (Originally 05/28/1956)   Zoster Vaccines- Shingrix (1 of 2) 03/05/2023 (Originally 11/24/2004)   Pneumonia Vaccine 56+ Years old (1 of 1 - PCV) 12/03/2023 (Originally 11/25/2019)   DEXA SCAN  12/03/2023 (Originally 11/25/2019)   Hepatitis C Screening  12/03/2023 (Originally 11/24/1972)   Fecal DNA (Cologuard)  12/03/2023 (Originally 02/24/2022)   INFLUENZA VACCINE  01/31/2023   Medicare Annual Wellness (AWV)  12/03/2023   MAMMOGRAM  06/15/2024   HPV VACCINES  Aged Out   Colonoscopy  Discontinued       Assessment  This is a routine wellness examination for [employer].  Health Maintenance: Due or Overdue Health Maintenance Due  Topic Date Due   DTaP/Tdap/Td (1 - Tdap) Never done    Angela Love does not need a referral for Community Assistance: Care Management:   no Social Work:    no Prescription Assistance:  no Nutrition/Diabetes Education:  no   Plan:  Personalized Goals  Goals Addressed               This Visit's Progress     Patient Stated (pt-stated)        Patient stated that she would like to work on getting her BP under control and work on her weight.  Personalized Health Maintenance & Screening Recommendations  Pneumococcal vaccine  Td vaccine Shingles vaccine Bone density scan Cologuard - ordered.  Patient declined at the pneumonia vaccine and  shingles vaccine.  Bone density scan- patient declines at this time.  She will bring the records for td vaccine and covid vaccines.  Lung Cancer Screening Recommended: no (Low Dose CT Chest recommended if Age 52-80 years, 20 pack-year currently smoking OR have quit w/in past 15 years) Hepatitis C Screening recommended: yes HIV Screening recommended: no  Advanced Directives: Written information was not prepared per patient's request.  Referrals & Orders Orders Placed This Encounter  Procedures   Cologuard    Follow-up Plan Follow-up with Charlton Amor, DO as planned Medicare wellness visit in one year. AVS printed and mailed to the patient.   I have personally reviewed and noted the following in the patient's chart:   Medical and social history Use of alcohol, tobacco or illicit drugs  Current medications and supplements Functional ability and status Nutritional status Physical activity Advanced directives List of other physicians Hospitalizations, surgeries, and ER visits in previous 12 months Vitals Screenings to include cognitive, depression, and falls Referrals and appointments  In addition, I have reviewed and discussed with Angela Love certain preventive protocols, quality metrics, and best practice recommendations. A written personalized care plan for preventive services as well as general preventive health recommendations is available and can be mailed to the patient at her request.      Modesto Charon, RN BSN  12/03/2022

## 2022-12-03 NOTE — Patient Instructions (Signed)
MEDICARE ANNUAL WELLNESS VISIT Health Maintenance Summary and Written Plan of Care  Angela Love ,  Thank you for allowing me to perform your Medicare Annual Wellness Visit and for your ongoing commitment to your health.   Health Maintenance & Immunization History Health Maintenance  Topic Date Due   DTaP/Tdap/Td (1 - Tdap) Never done   COVID-19 Vaccine (1) 12/19/2022 (Originally 05/28/1956)   Zoster Vaccines- Shingrix (1 of 2) 03/05/2023 (Originally 11/24/2004)   Pneumonia Vaccine 11+ Years old (1 of 1 - PCV) 12/03/2023 (Originally 11/25/2019)   DEXA SCAN  12/03/2023 (Originally 11/25/2019)   Hepatitis C Screening  12/03/2023 (Originally 11/24/1972)   Fecal DNA (Cologuard)  12/03/2023 (Originally 02/24/2022)   INFLUENZA VACCINE  01/31/2023   Medicare Annual Wellness (AWV)  12/03/2023   MAMMOGRAM  06/15/2024   HPV VACCINES  Aged Out   Colonoscopy  Discontinued   Immunization History  Administered Date(s) Administered   Influenza,inj,quad, With Preservative 03/03/2015    These are the patient goals that we discussed:  Goals Addressed               This Visit's Progress     Patient Stated (pt-stated)        Patient stated that she would like to work on getting her BP under control and work on her weight.         This is a list of Health Maintenance Items that are overdue or due now: Pneumococcal vaccine  Td vaccine Shingles vaccine Bone density scan Cologuard - ordered. Patient declined at the pneumonia vaccine and shingles vaccine.  Bone density scan- patient declines at this time.  She will bring the records for td vaccine and covid vaccines.  Orders/Referrals Placed Today: Orders Placed This Encounter  Procedures   Cologuard   (Contact our referral department at [phone] if you have not spoken with someone about your referral appointment within the next 5 days)    Follow-up Plan Follow-up with Charlton Amor, DO as planned Medicare wellness visit in one  year. AVS printed and mailed to the patient.     Health Maintenance, Female Adopting a healthy lifestyle and getting preventive care are important in promoting health and wellness. Ask your health care provider about: The right schedule for you to have regular tests and exams. Things you can do on your own to prevent diseases and keep yourself healthy. What should I know about diet, weight, and exercise? Eat a healthy diet  Eat a diet that includes plenty of vegetables, fruits, low-fat dairy products, and lean protein. Do not eat a lot of foods that are high in solid fats, added sugars, or sodium. Maintain a healthy weight Body mass index (BMI) is used to identify weight problems. It estimates body fat based on height and weight. Your health care provider can help determine your BMI and help you achieve or maintain a healthy weight. Get regular exercise Get regular exercise. This is one of the most important things you can do for your health. Most adults should: Exercise for at least 150 minutes each week. The exercise should increase your heart rate and make you sweat (moderate-intensity exercise). Do strengthening exercises at least twice a week. This is in addition to the moderate-intensity exercise. Spend less time sitting. Even light physical activity can be beneficial. Watch cholesterol and blood lipids Have your blood tested for lipids and cholesterol at 68 years of age, then have this test every 5 years. Have your cholesterol levels checked more often if:  Your lipid or cholesterol levels are high. You are older than 68 years of age. You are at high risk for heart disease. What should I know about cancer screening? Depending on your health history and family history, you may need to have cancer screening at various ages. This may include screening for: Breast cancer. Cervical cancer. Colorectal cancer. Skin cancer. Lung cancer. What should I know about heart disease,  diabetes, and high blood pressure? Blood pressure and heart disease High blood pressure causes heart disease and increases the risk of stroke. This is more likely to develop in people who have high blood pressure readings or are overweight. Have your blood pressure checked: Every 3-5 years if you are 79-33 years of age. Every year if you are 81 years old or older. Diabetes Have regular diabetes screenings. This checks your fasting blood sugar level. Have the screening done: Once every three years after age 2 if you are at a normal weight and have a low risk for diabetes. More often and at a younger age if you are overweight or have a high risk for diabetes. What should I know about preventing infection? Hepatitis B If you have a higher risk for hepatitis B, you should be screened for this virus. Talk with your health care provider to find out if you are at risk for hepatitis B infection. Hepatitis C Testing is recommended for: Everyone born from [birth year] through 1966. Anyone with known risk factors for hepatitis C. Sexually transmitted infections (STIs) Get screened for STIs, including gonorrhea and chlamydia, if: You are sexually active and are younger than 68 years of age. You are older than 68 years of age and your health care provider tells you that you are at risk for this type of infection. Your sexual activity has changed since you were last screened, and you are at increased risk for chlamydia or gonorrhea. Ask your health care provider if you are at risk. Ask your health care provider about whether you are at high risk for HIV. Your health care provider may recommend a prescription medicine to help prevent HIV infection. If you choose to take medicine to prevent HIV, you should first get tested for HIV. You should then be tested every 3 months for as long as you are taking the medicine. Pregnancy If you are about to stop having your period (premenopausal) and you may become pregnant,  seek counseling before you get pregnant. Take 400 to 800 micrograms (mcg) of folic acid every day if you become pregnant. Ask for birth control (contraception) if you want to prevent pregnancy. Osteoporosis and menopause Osteoporosis is a disease in which the bones lose minerals and strength with aging. This can result in bone fractures. If you are 35 years old or older, or if you are at risk for osteoporosis and fractures, ask your health care provider if you should: Be screened for bone loss. Take a calcium or vitamin D supplement to lower your risk of fractures. Be given hormone replacement therapy (HRT) to treat symptoms of menopause. Follow these instructions at home: Alcohol use Do not drink alcohol if: Your health care provider tells you not to drink. You are pregnant, may be pregnant, or are planning to become pregnant. If you drink alcohol: Limit how much you have to: 0-1 drink a day. Know how much alcohol is in your drink. In the [place] one drink equals one 12 oz bottle of beer (355 mL), one 5 oz glass of wine (148 mL), or one  1 oz glass of hard liquor (44 mL). Lifestyle Do not use any products that contain nicotine or tobacco. These products include cigarettes, chewing tobacco, and vaping devices, such as e-cigarettes. If you need help quitting, ask your health care provider. Do not use street drugs. Do not share needles. Ask your health care provider for help if you need support or information about quitting drugs. General instructions Schedule regular health, dental, and eye exams. Stay current with your vaccines. Tell your health care provider if: You often feel depressed. You have ever been abused or do not feel safe at home. Summary Adopting a healthy lifestyle and getting preventive care are important in promoting health and wellness. Follow your health care provider's instructions about healthy diet, exercising, and getting tested or screened for diseases. Follow your  health care provider's instructions on monitoring your cholesterol and blood pressure. This information is not intended to replace advice given to you by your health care provider. Make sure you discuss any questions you have with your health care provider. Document Revised: 11/07/2020 Document Reviewed: 11/07/2020 Elsevier Patient Education  2024 [employer].

## 2022-12-06 ENCOUNTER — Ambulatory Visit (INDEPENDENT_AMBULATORY_CARE_PROVIDER_SITE_OTHER): Payer: Medicare Other | Admitting: Family Medicine

## 2022-12-06 ENCOUNTER — Encounter: Payer: Self-pay | Admitting: Family Medicine

## 2022-12-06 VITALS — BP 135/75 | HR 106 | Ht 59.0 in | Wt 176.0 lb

## 2022-12-06 DIAGNOSIS — I10 Essential (primary) hypertension: Secondary | ICD-10-CM | POA: Diagnosis not present

## 2022-12-06 DIAGNOSIS — R7303 Prediabetes: Secondary | ICD-10-CM | POA: Diagnosis not present

## 2022-12-06 LAB — POCT GLYCOSYLATED HEMOGLOBIN (HGB A1C): Hemoglobin A1C: 5.9 % — AB (ref 4.0–5.6)

## 2022-12-06 NOTE — Assessment & Plan Note (Signed)
-   poc A1c 5.9  - increased from last visit, had a long discussion about diet and exercise and eating in moderation.  - will go ahead and do diet control at this time.

## 2022-12-06 NOTE — Progress Notes (Signed)
     Established patient visit   Patient: Angela Love   DOB: 18-Jun-1956   67 y.o. Female  MRN: [id number] Visit Date: 12/06/2022  Today's healthcare provider: Charlton Amor, DO   Chief Complaint  Patient presents with   Follow-up    SUBJECTIVE    Chief Complaint  Patient presents with   Follow-up   HPI  HTN - on lisinopril 10mg  - BP well controlled today at 135/75  Prediabetes -A1c last visit was 5.8  Review of Systems  Constitutional:  Negative for activity change, fatigue and fever.  Respiratory:  Negative for cough and shortness of breath.   Cardiovascular:  Negative for chest pain.  Gastrointestinal:  Negative for abdominal pain.  Genitourinary:  Negative for difficulty urinating.       Current Meds  Medication Sig   amitriptyline (ELAVIL) 10 MG tablet Take 25 mg by mouth at bedtime. Unsure dose   lisinopril (ZESTRIL) 10 MG tablet Take 1 tablet (10 mg total) by mouth daily.    OBJECTIVE    BP 135/75   Pulse (!) 106   Ht 4\' 11"  (1.499 m)   Wt 176 lb (79.8 kg)   SpO2 99%   BMI 35.55 kg/m   Physical Exam Vitals and nursing note reviewed.  Constitutional:      General: She is not in acute distress.    Appearance: Normal appearance.  HENT:     Head: Normocephalic and atraumatic.     Right Ear: External ear normal.     Left Ear: External ear normal.     Nose: Nose normal.  Eyes:     Conjunctiva/sclera: Conjunctivae normal.  Cardiovascular:     Rate and Rhythm: Normal rate and regular rhythm.  Pulmonary:     Effort: Pulmonary effort is normal.     Breath sounds: Normal breath sounds.  Neurological:     General: No focal deficit present.     Mental Status: She is alert and oriented to person, place, and time.  Psychiatric:        Mood and Affect: Mood normal.        Behavior: Behavior normal.        Thought Content: Thought content normal.        Judgment: Judgment normal.       ASSESSMENT & PLAN    Problem List Items Addressed This  Visit       Cardiovascular and Mediastinum   Primary hypertension    Well controlled today, continue medications - will order bmp for elyte monitoring      Relevant Orders   BASIC METABOLIC PANEL WITH GFR     Other   Prediabetes - Primary    - poc A1c 5.9  - increased from last visit, had a long discussion about diet and exercise and eating in moderation.  - will go ahead and do diet control at this time.       Relevant Orders   POCT HgB A1C (Completed)    Return in about 6 months (around 06/07/2023).      No orders of the defined types were placed in this encounter.   Orders Placed This Encounter  Procedures   BASIC METABOLIC PANEL WITH GFR   POCT HgB A1C     Charlton Amor, DO  [facility] at [facility] [phone] (phone) [phone] (fax)  [facility]

## 2022-12-06 NOTE — Assessment & Plan Note (Signed)
Well controlled today, continue medications - will order bmp for elyte monitoring

## 2022-12-07 LAB — BASIC METABOLIC PANEL WITH GFR
BUN: 11 mg/dL (ref 7–25)
CO2: 27 mmol/L (ref 20–32)
Calcium: 9.6 mg/dL (ref 8.6–10.4)
Chloride: 104 mmol/L (ref 98–110)
Creat: 0.85 mg/dL (ref 0.50–1.05)
Glucose, Bld: 103 mg/dL — ABNORMAL HIGH (ref 65–99)
Potassium: 4.5 mmol/L (ref 3.5–5.3)
Sodium: 142 mmol/L (ref 135–146)
eGFR: 75 mL/min/{1.73_m2} (ref >=60)

## 2023-01-03 LAB — COLOGUARD: COLOGUARD: NEGATIVE

## 2023-05-27 ENCOUNTER — Other Ambulatory Visit: Payer: Self-pay | Admitting: Obstetrics and Gynecology

## 2023-05-27 DIAGNOSIS — Z1231 Encounter for screening mammogram for malignant neoplasm of breast: Secondary | ICD-10-CM

## 2023-06-07 ENCOUNTER — Ambulatory Visit: Payer: Medicare Other | Admitting: Family Medicine

## 2023-06-10 ENCOUNTER — Encounter: Payer: Self-pay | Admitting: Family Medicine

## 2023-06-10 ENCOUNTER — Ambulatory Visit (INDEPENDENT_AMBULATORY_CARE_PROVIDER_SITE_OTHER): Payer: Medicare Other | Admitting: Family Medicine

## 2023-06-10 VITALS — BP 179/94 | HR 95 | Ht 59.0 in | Wt 177.0 lb

## 2023-06-10 DIAGNOSIS — R7303 Prediabetes: Secondary | ICD-10-CM

## 2023-06-10 DIAGNOSIS — I10 Essential (primary) hypertension: Secondary | ICD-10-CM

## 2023-06-10 LAB — POCT GLYCOSYLATED HEMOGLOBIN (HGB A1C): Hemoglobin A1C: 5.9 % — AB (ref 4.0–5.6)

## 2023-06-10 MED ORDER — LISINOPRIL 10 MG PO TABS: 10.0000 mg | ORAL_TABLET | Freq: Every day | ORAL | 3 refills | Status: DC

## 2023-06-10 NOTE — Progress Notes (Signed)
Established patient visit   Patient: Angela Love   DOB: [birth year]   67 y.o. Female  MRN: [id number] Visit Date: 06/10/2023  Today's healthcare provider: Charlton Amor, DO   Chief Complaint  Patient presents with   Medical Management of Chronic Issues    PDM last A1C 5.9    SUBJECTIVE    Chief Complaint  Patient presents with   Medical Management of Chronic Issues    PDM last A1C 5.9   HPI HPI     Medical Management of Chronic Issues    Additional comments: PDM last A1C 5.9      Last edited by Roselyn Reef, CMA on 06/10/2023  9:04 AM.       Pt presents for follow up on prediabetes and HTN.   Prediabetes - currently on diet and exercise modification  HTN - lisinopril 10mg   - doing well   Review of Systems  Constitutional:  Negative for activity change, fatigue and fever.  Respiratory:  Negative for cough and shortness of breath.   Cardiovascular:  Negative for chest pain.  Gastrointestinal:  Negative for abdominal pain.  Genitourinary:  Negative for difficulty urinating.       Current Meds  Medication Sig   amitriptyline (ELAVIL) 10 MG tablet Take 25 mg by mouth at bedtime. Unsure dose   lisinopril (ZESTRIL) 10 MG tablet Take 1 tablet (10 mg total) by mouth daily.   [DISCONTINUED] lisinopril (ZESTRIL) 10 MG tablet Take 1 tablet (10 mg total) by mouth daily.    OBJECTIVE    BP (!) 179/94 (BP Location: Left Arm, Patient Position: Sitting, Cuff Size: Normal)   Pulse 95   Ht 4\' 11"  (1.499 m)   Wt 177 lb (80.3 kg)   SpO2 99%   BMI 35.75 kg/m   Physical Exam Vitals and nursing note reviewed.  Constitutional:      General: She is not in acute distress.    Appearance: Normal appearance.  HENT:     Head: Normocephalic and atraumatic.     Right Ear: External ear normal.     Left Ear: External ear normal.     Nose: Nose normal.  Eyes:     Conjunctiva/sclera: Conjunctivae normal.  Cardiovascular:     Rate and Rhythm: Normal rate and  regular rhythm.  Pulmonary:     Effort: Pulmonary effort is normal.     Breath sounds: Normal breath sounds.  Neurological:     General: No focal deficit present.     Mental Status: She is alert and oriented to person, place, and time.  Psychiatric:        Mood and Affect: Mood normal.        Behavior: Behavior normal.        Thought Content: Thought content normal.        Judgment: Judgment normal.        ASSESSMENT & PLAN    Problem List Items Addressed This Visit       Cardiovascular and Mediastinum   Primary hypertension - Primary    Doing well, will obtain BMP to monitor kidney function and K however patient would like to check levels at visit in 6 months. - continue lisionpril 10mg  - bp elevated on exam today so we will repeat, she said she has checked her blood pressure at home and she said it was normal - repeat slightly elevated, will have her repeat at home and send [place] some values  Relevant Medications   lisinopril (ZESTRIL) 10 MG tablet     Other   Prediabetes    POC A1c obtained today and is stable at 5.9% had a discussion with patient and we can check this yearly Will continue lifestyle modifications      Relevant Orders   POCT HgB A1C (Completed)    Return in about 6 months (around 12/09/2023) for prediabetes and HTN follow up.      Meds ordered this encounter  Medications   lisinopril (ZESTRIL) 10 MG tablet    Sig: Take 1 tablet (10 mg total) by mouth daily.    Dispense:  90 tablet    Refill:  3    Orders Placed This Encounter  Procedures   POCT HgB A1C     Charlton Amor, DO  [facility] at [facility] [phone] (phone) [phone] (fax)  [facility]

## 2023-06-10 NOTE — Assessment & Plan Note (Addendum)
POC A1c obtained today and is stable at 5.9% had a discussion with patient and we can check this yearly Will continue lifestyle modifications

## 2023-06-10 NOTE — Assessment & Plan Note (Addendum)
Doing well, will obtain BMP to monitor kidney function and K however patient would like to check levels at visit in 6 months. - continue lisionpril 10mg  - bp elevated on exam today so we will repeat, she said she has checked her blood pressure at home and she said it was normal - repeat slightly elevated, will have her repeat at home and send [place] some values

## 2023-06-27 ENCOUNTER — Other Ambulatory Visit: Payer: Self-pay

## 2023-06-27 DIAGNOSIS — I10 Essential (primary) hypertension: Secondary | ICD-10-CM

## 2023-06-27 MED ORDER — LISINOPRIL 10 MG PO TABS: 10.0000 mg | ORAL_TABLET | Freq: Every day | ORAL | 3 refills | Status: DC

## 2023-07-05 ENCOUNTER — Ambulatory Visit
Admission: RE | Admit: 2023-07-05 | Discharge: 2023-07-05 | Disposition: A | Discharge status: Home / Self Care | Payer: Medicare Other | Source: Ambulatory Visit | Attending: Obstetrics and Gynecology | Admitting: Obstetrics and Gynecology

## 2023-07-05 DIAGNOSIS — Z1231 Encounter for screening mammogram for malignant neoplasm of breast: Secondary | ICD-10-CM

## 2023-11-07 ENCOUNTER — Encounter: Payer: Self-pay | Admitting: Family Medicine

## 2023-12-09 ENCOUNTER — Ambulatory Visit: Admitting: Family Medicine

## 2023-12-09 ENCOUNTER — Ambulatory Visit: Payer: Medicare Other | Admitting: Family Medicine

## 2023-12-16 ENCOUNTER — Encounter: Payer: Self-pay | Admitting: Family Medicine

## 2023-12-16 ENCOUNTER — Ambulatory Visit (INDEPENDENT_AMBULATORY_CARE_PROVIDER_SITE_OTHER): Admitting: Family Medicine

## 2023-12-16 VITALS — BP 134/86 | HR 88 | Ht 59.0 in | Wt 178.0 lb

## 2023-12-16 DIAGNOSIS — Z136 Encounter for screening for cardiovascular disorders: Secondary | ICD-10-CM | POA: Diagnosis not present

## 2023-12-16 DIAGNOSIS — L309 Dermatitis, unspecified: Secondary | ICD-10-CM | POA: Diagnosis not present

## 2023-12-16 DIAGNOSIS — R7303 Prediabetes: Secondary | ICD-10-CM

## 2023-12-16 DIAGNOSIS — I10 Essential (primary) hypertension: Secondary | ICD-10-CM | POA: Diagnosis not present

## 2023-12-16 DIAGNOSIS — Z13220 Encounter for screening for lipoid disorders: Secondary | ICD-10-CM | POA: Insufficient documentation

## 2023-12-16 LAB — POCT GLYCOSYLATED HEMOGLOBIN (HGB A1C): Hemoglobin A1C: 6 % — AB (ref 4.0–5.6)

## 2023-12-16 NOTE — Assessment & Plan Note (Signed)
 Uner skin folds. Using Clobetasol ointment with good results. Recommend preventing the skin from contact. Follow-up is symptoms do not improve.

## 2023-12-16 NOTE — Assessment & Plan Note (Addendum)
 Taking lisinopril  10 mg daily as prescribed. Takes at bedtime.  Denies chest pain, shortness of breath, lower extremity edema, vision changes, headaches.  Not at goal in office, home readings are well controlled. Continue to monitor at home, if does not return to < 130/80, notify provider.  CMP today. DASH diet and moderate exercise. No refills needed. Follow-up in 6 months.

## 2023-12-16 NOTE — Assessment & Plan Note (Signed)
 POC A1C today: 6.0 This is measured on a different machine from previous 5.9 Essentially unchanged. Recommend avoiding added sugars and processed carbohydrates in the diet.  Recheck in 6 months.

## 2023-12-16 NOTE — Progress Notes (Signed)
 Established Patient Office Visit  Subjective   Patient ID: Angela Love, female    DOB: June 16, 1956  Age: 68 y.o. MRN: [id number]  Chief Complaint  Patient presents with   Diabetes   Hypertension    HPI  Hypertension Medication compliance:  Taking lisinopril  10 mg daily as prescribed. Takes at bedtime.  Denies chest pain, shortness of breath, lower extremity edema, vision changes, headaches.  Pertinent lab work: CMP Monitoring at home: 124/71, 128/74, 144/88 Tolerating medication well: no side effects reported.  Continue current medication regimen: no change, not well controlled in the office.  Follow-up: 6 months   Pre-diabetes: Last POC A1C 5.9 Not currently on medications.  Today POC A1C: 6.0 This reading is from different machines.    Skin:  Under skin folds  Using clobetasol ointment with good results. Recommend avoiding skin to skin contact.       ROS    Objective:     BP 134/86 (BP Location: Right Arm, Cuff Size: Large)   Pulse 88   Ht 4' 11 (1.499 m)   Wt 178 lb (80.7 kg)   SpO2 97%   BMI 35.95 kg/m    Physical Exam Vitals and nursing note reviewed.  Constitutional:      General: She is not in acute distress.    Appearance: Normal appearance.   Cardiovascular:     Rate and Rhythm: Regular rhythm.     Heart sounds: Normal heart sounds.  Pulmonary:     Effort: Pulmonary effort is normal.     Breath sounds: Normal breath sounds.   Skin:    General: Skin is warm and dry.   Neurological:     General: No focal deficit present.     Mental Status: She is alert. Mental status is at baseline.   Psychiatric:        Mood and Affect: Mood normal.        Behavior: Behavior normal.        Thought Content: Thought content normal.        Judgment: Judgment normal.     Results for orders placed or performed in visit on 12/16/23  POCT HgB A1C  Result Value Ref Range   Hemoglobin A1C 6.0 (A) 4.0 - 5.6 %   HbA1c POC (<> result, manual entry)      HbA1c, POC (prediabetic range)     HbA1c, POC (controlled diabetic range)        The 10-year ASCVD risk score (Arnett DK, et al., 2019) is: 11.5%    Assessment & Plan:   Problem List Items Addressed This Visit     Primary hypertension   Taking lisinopril  10 mg daily as prescribed. Takes at bedtime.  Denies chest pain, shortness of breath, lower extremity edema, vision changes, headaches.  Not at goal in office, home readings are well controlled. Continue to monitor at home, if does not return to < 130/80, notify provider.  CMP today. DASH diet and moderate exercise. No refills needed. Follow-up in 6 months.       Relevant Orders   Comprehensive metabolic panel with GFR   Prediabetes - Primary   POC A1C today: 6.0 This is measured on a different machine from previous 5.9 Essentially unchanged. Recommend avoiding added sugars and processed carbohydrates in the diet.  Recheck in 6 months.        Relevant Orders   POCT HgB A1C (Completed)   Encounter for lipid screening for cardiovascular disease   Relevant Orders  Lipid panel   Dermatitis   Uner skin folds. Using Clobetasol ointment with good results. Recommend preventing the skin from contact. Follow-up is symptoms do not improve.       Agrees with plan of care discussed.  Questions answered.   Return in about 6 months (around 06/16/2024) for DM, HTN.    Mickiel Albany, FNP

## 2023-12-16 NOTE — Patient Instructions (Signed)
 Healthy Heart:  Recommend heart healthy/Mediterranean diet, with whole grains, fruits, vegetable, fish, lean meats, nuts, and olive oil. Limit salt. Recommend moderate walking, 3-5 times/week for 30-50 minutes each session. Aim for at least 150 minutes.week. Goal should be pace of 3 miles/hours, or walking 1.5 miles in 30 minutes Recommend avoidance of tobacco products. Avoid excess alcohol.

## 2023-12-17 ENCOUNTER — Ambulatory Visit: Payer: Self-pay | Admitting: Family Medicine

## 2023-12-17 LAB — LIPID PANEL
Chol/HDL Ratio: 3.3 ratio (ref 0.0–4.4)
Cholesterol, Total: 184 mg/dL (ref 100–199)
HDL: 56 mg/dL (ref >39)
LDL Chol Calc (NIH): 109 mg/dL — ABNORMAL HIGH (ref 0–99)
Triglycerides: 108 mg/dL (ref 0–149)
VLDL Cholesterol Cal: 19 mg/dL (ref 5–40)

## 2023-12-17 LAB — COMPREHENSIVE METABOLIC PANEL WITH GFR
ALT: 26 IU/L (ref 0–32)
AST: 18 IU/L (ref 0–40)
Albumin: 4.3 g/dL (ref 3.9–4.9)
Alkaline Phosphatase: 94 IU/L (ref 44–121)
BUN/Creatinine Ratio: 16 (ref 12–28)
BUN: 12 mg/dL (ref 8–27)
Bilirubin Total: 0.5 mg/dL (ref 0.0–1.2)
CO2: 20 mmol/L (ref 20–29)
Calcium: 9.6 mg/dL (ref 8.7–10.3)
Chloride: 104 mmol/L (ref 96–106)
Creatinine, Ser: 0.77 mg/dL (ref 0.57–1.00)
Globulin, Total: 2.2 g/dL (ref 1.5–4.5)
Glucose: 96 mg/dL (ref 70–99)
Potassium: 4.5 mmol/L (ref 3.5–5.2)
Sodium: 141 mmol/L (ref 134–144)
Total Protein: 6.5 g/dL (ref 6.0–8.5)
eGFR: 83 mL/min/{1.73_m2} (ref >59)

## 2024-06-08 ENCOUNTER — Other Ambulatory Visit: Payer: Self-pay | Admitting: Obstetrics and Gynecology

## 2024-06-08 DIAGNOSIS — Z1231 Encounter for screening mammogram for malignant neoplasm of breast: Secondary | ICD-10-CM

## 2024-06-16 ENCOUNTER — Encounter: Payer: Self-pay | Admitting: Family Medicine

## 2024-06-16 ENCOUNTER — Ambulatory Visit: Admitting: Family Medicine

## 2024-06-16 VITALS — BP 140/85 | HR 105 | Ht 59.0 in | Wt 179.0 lb

## 2024-06-16 DIAGNOSIS — I10 Essential (primary) hypertension: Secondary | ICD-10-CM

## 2024-06-16 DIAGNOSIS — R7303 Prediabetes: Secondary | ICD-10-CM

## 2024-06-16 LAB — POCT GLYCOSYLATED HEMOGLOBIN (HGB A1C): Hemoglobin A1C: 5.8 % — AB (ref 4.0–5.6)

## 2024-06-16 MED ORDER — LISINOPRIL 10 MG PO TABS: 10.0000 mg | ORAL_TABLET | Freq: Every day | ORAL | 3 refills | Status: AC

## 2024-06-16 NOTE — Assessment & Plan Note (Signed)
 Diet controlled.  Has not been watching her diet recently.  12/16/23: A1C: 6.0 06/16/24: POC A1C: 5.8 Urine microalbumin today.  Avoiding added sugars and processed carbohydrates is important to control your blood sugar as well as 150 minutes of moderate exercise per week.

## 2024-06-16 NOTE — Assessment & Plan Note (Addendum)
 Taking lisinopril  10 mg daily  Denies chest pain and shortness of breath.  Home reading: 124/78, 135/84 Improved upon recheck in the office, much better with  home readings.  Walks  with her job, no formal exercise routine.  Endorses eating salty food yesterday.  Refills sent. Follow-up in 6 months.  Continue to monitor blood pressure at home and notify if does not remain around 130/80 or less.  BMP today

## 2024-06-16 NOTE — Patient Instructions (Signed)
 Healthy Heart:  Recommend heart healthy/Mediterranean diet, with whole grains, fruits, vegetable, fish, lean meats, nuts, and olive oil. Limit salt. Recommend moderate walking, 3-5 times/week for 30-50 minutes each session. Aim for at least 150 minutes.week. Goal should be pace of 3 miles/hours, or walking 1.5 miles in 30 minutes Recommend avoidance of tobacco products. Avoid excess alcohol.

## 2024-06-16 NOTE — Progress Notes (Signed)
 Established Patient Office Visit  Subjective   Patient ID: Angela Love, female    DOB: 08-19-1955  Age: 68 y.o. MRN: [id number]  Chief Complaint  Patient presents with   Medical Management of Chronic Issues    DM, HTN    HTN: Taking lisinopril  10 mg daily  Denies chest pain and shortness of breath.  Home reading: 124/78, 135/84 Improved upon recheck in the office, much better with  home readings.  Walks  with her job, no formal exercise routine.  Endorses eating salty food yesterday.  Refills sent. Follow-up in 6 months.   DM: diet controlled.  Has not been watching her diet recently.   12/16/23: A1C: 6.0 06/16/24: POC A1C: 5.8        ROS    Objective:     BP (!) 140/85 (Patient Position: Sitting, Cuff Size: Large)   Pulse (!) 105   Ht 4' 11 (1.499 m)   Wt 179 lb (81.2 kg)   SpO2 99%   BMI 36.15 kg/m    Physical Exam Vitals and nursing note reviewed.  Constitutional:      General: She is not in acute distress.    Appearance: Normal appearance.  Cardiovascular:     Rate and Rhythm: Tachycardia present.     Heart sounds: Normal heart sounds.  Pulmonary:     Effort: Pulmonary effort is normal.     Breath sounds: Normal breath sounds.  Skin:    General: Skin is warm and dry.  Neurological:     General: No focal deficit present.     Mental Status: She is alert. Mental status is at baseline.  Psychiatric:        Mood and Affect: Mood normal.        Behavior: Behavior normal.        Thought Content: Thought content normal.        Judgment: Judgment normal.      Results for orders placed or performed in visit on 06/16/24  POCT glycosylated hemoglobin (Hb A1C)  Result Value Ref Range   Hemoglobin A1C 5.8 (A) 4.0 - 5.6 %   HbA1c POC (<> result, manual entry)     HbA1c, POC (prediabetic range)     HbA1c, POC (controlled diabetic range)        The 10-year ASCVD risk score (Arnett DK, et al., 2019) is: 13%    Assessment & Plan:   Problem  List Items Addressed This Visit     Primary hypertension   Taking lisinopril  10 mg daily  Denies chest pain and shortness of breath.  Home reading: 124/78, 135/84 Improved upon recheck in the office, much better with  home readings.  Walks  with her job, no formal exercise routine.  Endorses eating salty food yesterday.  Refills sent. Follow-up in 6 months.  Continue to monitor blood pressure at home and notify if does not remain around 130/80 or less.  BMP today      Relevant Medications   lisinopril  (ZESTRIL ) 10 MG tablet   Other Relevant Orders   Basic metabolic panel with GFR   Microalbumin / creatinine urine ratio   Prediabetes - Primary   Diet controlled.  Has not been watching her diet recently.  12/16/23: A1C: 6.0 06/16/24: POC A1C: 5.8 Urine microalbumin today.  Avoiding added sugars and processed carbohydrates is important to control your blood sugar as well as 150 minutes of moderate exercise per week.       Relevant Orders  POCT glycosylated hemoglobin (Hb A1C) (Completed)   Microalbumin / creatinine urine ratio  Agrees with plan of care discussed.  Questions answered.   Return in about 6 months (around 12/15/2024) for HTN, pre-diabetes and annual .    Angela JONELLE Brownie, FNP

## 2024-06-17 ENCOUNTER — Ambulatory Visit: Payer: Self-pay | Admitting: Family Medicine

## 2024-06-17 LAB — BASIC METABOLIC PANEL WITH GFR
BUN/Creatinine Ratio: 15 (ref 12–28)
BUN: 12 mg/dL (ref 8–27)
CO2: 24 mmol/L (ref 20–29)
Calcium: 10 mg/dL (ref 8.7–10.3)
Chloride: 102 mmol/L (ref 96–106)
Creatinine, Ser: 0.82 mg/dL (ref 0.57–1.00)
Glucose: 143 mg/dL — ABNORMAL HIGH (ref 70–99)
Potassium: 4.4 mmol/L (ref 3.5–5.2)
Sodium: 142 mmol/L (ref 134–144)
eGFR: 77 mL/min/1.73 (ref >59)

## 2024-06-17 LAB — MICROALBUMIN / CREATININE URINE RATIO
Creatinine, Urine: 107.9 mg/dL
Microalb/Creat Ratio: 6 mg/g{creat} (ref 0–29)
Microalbumin, Urine: 6 ug/mL

## 2024-07-10 ENCOUNTER — Ambulatory Visit
Admission: RE | Admit: 2024-07-10 | Discharge: 2024-07-10 | Disposition: A | Source: Ambulatory Visit | Attending: Obstetrics and Gynecology | Admitting: Obstetrics and Gynecology

## 2024-07-10 DIAGNOSIS — Z1231 Encounter for screening mammogram for malignant neoplasm of breast: Secondary | ICD-10-CM

## 2024-12-15 ENCOUNTER — Encounter: Admitting: Urgent Care
